# Patient Record
Sex: Male | Born: 1985 | Hispanic: No | Marital: Married | Smoking: Former smoker
Health system: Southern US, Community
[De-identification: ages and names within clinical notes are randomized; demographics above are authoritative.]

## PROBLEM LIST (undated history)

## (undated) DIAGNOSIS — L309 Dermatitis, unspecified: Secondary | ICD-10-CM

## (undated) DIAGNOSIS — E669 Obesity, unspecified: Secondary | ICD-10-CM

## (undated) DIAGNOSIS — I10 Essential (primary) hypertension: Secondary | ICD-10-CM

## (undated) DIAGNOSIS — K219 Gastro-esophageal reflux disease without esophagitis: Secondary | ICD-10-CM

## (undated) DIAGNOSIS — E119 Type 2 diabetes mellitus without complications: Secondary | ICD-10-CM

## (undated) DIAGNOSIS — N471 Phimosis: Secondary | ICD-10-CM

## (undated) HISTORY — PX: KNEE ARTHROSCOPY W/ ACL RECONSTRUCTION: SHX1858

---
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
## 2009-05-04 ENCOUNTER — Emergency Department (HOSPITAL_COMMUNITY): Admission: EM | Admit: 2009-05-04 | Discharge: 2009-05-05 | Payer: Self-pay | Admitting: Emergency Medicine

## 2010-05-08 ENCOUNTER — Emergency Department (HOSPITAL_COMMUNITY): Admission: EM | Admit: 2010-05-08 | Discharge: 2010-05-08 | Payer: Self-pay | Admitting: Emergency Medicine

## 2010-05-12 ENCOUNTER — Emergency Department (HOSPITAL_COMMUNITY): Admission: EM | Admit: 2010-05-12 | Discharge: 2010-05-12 | Payer: Self-pay | Admitting: Emergency Medicine

## 2010-09-22 LAB — DIFFERENTIAL
Basophils Absolute: 0 10*3/uL (ref 0.0–0.1)
Eosinophils Relative: 2 % (ref 0–5)
Lymphocytes Relative: 32 % (ref 12–46)
Lymphs Abs: 1.8 10*3/uL (ref 0.7–4.0)
Monocytes Absolute: 0.4 10*3/uL (ref 0.1–1.0)
Monocytes Relative: 8 % (ref 3–12)
Neutro Abs: 3.3 10*3/uL (ref 1.7–7.7)

## 2010-09-22 LAB — CBC
HCT: 44 % (ref 39.0–52.0)
Hemoglobin: 15.2 g/dL (ref 13.0–17.0)
RBC: 4.82 MIL/uL (ref 4.22–5.81)
RDW: 12.6 % (ref 11.5–15.5)

## 2010-09-22 LAB — POCT CARDIAC MARKERS
CKMB, poc: 3.1 ng/mL (ref 1.0–8.0)
Troponin i, poc: <0.05 ng/mL (ref 0.00–0.09)

## 2010-11-05 NOTE — Op Note (Signed)
NAME:  Perry Gibbs, Perry Gibbs                             ACCOUNT NO.:  [id number]   MEDICAL RECORD NO.:  [id number]                   PATIENT TYPE:  EMS   LOCATION:  ED                                   FACILITY:  [facility]   PHYSICIAN:  Katy Fitch. Naaman Plummer., M.D.          DATE OF BIRTH:  1986/02/14   DATE OF PROCEDURE:  01/24/2004  DATE OF DISCHARGE:                                 OPERATIVE REPORT   PREOPERATIVE DIAGNOSIS:  Paint gun injection injury of left palm involving  metacarpal region of index finger extending to carpal canal and extending  along superficial flexor sheath, left index finger.   POSTOPERATIVE DIAGNOSIS:  Paint gun injection injury of left palm involving  metacarpal region of index finger extending to carpal canal and extending  along superficial flexor sheath, left index finger.   OPERATION:  Wide excision and extensive debridement of injected latex paint  into left hand including palm, carpal tunnel region, and superficial areas  of flexor sheath, index finger.   OPERATING SURGEON:  Josephine Igo, M.D.   ASSISTANT:  None.   ANESTHESIA:  Marcaine 0.25% and 1% lidocaine wrist block of median and  radial nerve supplemented by IV Versed and propofol.   SUPERVISING ANESTHESIOLOGIST:  Dr. Shireen Quan   INDICATIONS:  Perry Gibbs is an 25 year old, right-hand-dominant Hispanic  male, who presented to the [facility] Emergency Room shortly following 8 p.m. this  evening, reporting a home paint gun injection of his left palm.   He was planning on painting the walls of the house with a latex water-based  paint, when he accidentally discharged the paint gun into his left palm,  distal to the thenar eminence overlying the index metacarpal neck.   He had acute pain, swelling, and inability to fully flex his index finger.  He noted altered sensibility in the index finger.   He was brought to the [facility] Emergency Room where he was evaluated by Dr.  Jennette Kettle, and a hand trauma consult was  activated.   Through translation with Faustino, a registered nurse, who works at the  [employer], we had a detailed history, completed physical  examination, and discussed his injury in detail with Perry Gibbs.   We informed Perry Gibbs that he appeared to have a severe pain injection into  the left hand that would require urgent incision and drainage and  debridement on an acute basis.   We further informed him that he would require serial debridement and might  require a series of whirlpool treatments and serial wound care.   The prognosis with these injuries is often quite poor with the risk of  secondary infection, significant stiffness, and chronic impairment of  sensibility.   He was advised to proceed directly to the operating room for incision and  drainage at this time with further discussion of prognosis once we knew the  exact extent of his paint injection predicament.  After informed consent, he is brought to the operating room at this time.   DESCRIPTION OF PROCEDURE:  Perry Gibbs was brought to the operating room and  placed in supine position upon the operating room table.   Following very light sedation with Versed and propofol, the left arm was  prepped with Betadine surgical solution and sterilely draped.  A pneumatic  tourniquet was applied to the proximal brachium.   A total of 7 mL of 0.25% Marcaine and 1% lidocaine mixed 50:50 were injected  into the region of the median nerve at the wrist and the area of the radial  sensory branches along the region of the radial styloid.   After 10 minutes, we had satisfactory anesthesia.   The arm was exsanguinated with an Esmarch bandage and an arterial tourniquet  to the proximal brachium was inflated to 250 mmHg due to mild systolic  hypertension.   A Bruner zigzag incision was fashioned, extending the traumatic wound along  the thenar crease proximally and distally towards the proximal finger   flexion crease.   A very large volume of off-white latex paint was encountered, both  superficial and deep to the palmar fascia.   The palmar fascia was meticulously dissected and was thoroughly stained with  paint.  A large volume of paint was recovered from between the deep surface  of the palmar fascia, pretendinous fibers, and the flexor sheath.  There was  considerable paint surrounding the A1 pulley, extending distally towards the  A2 pulley.   It did not appear that the flexor sheath had been directly injected.  The  paint did, however, track superficial to the flexor tendons, towards the  carpal, and into the distal carpal canal.   The wound was extended proximally to the level of the carpal canal, and all  visible pain deposits were removed with a small rongeur.   There was considerable staining of the fat and fascia.  The fascia was  resected, and the stained fat was thoroughly lavaged with copious saline and  abraded with a saline-soaked sponge.   About 95% of the paint volume was extracted; however, there was considerable  staining of the tissues including the deep surface of the dermis that simply  could not be removed short of simply excising the tissue.   The flexor tendons were carefully inspected.  I could identify any paint  that had passed distally beyond the A1 pulley and could not find paint that  had gotten into the carpal canal in large volume except for this most distal  portion of it along the path of the superficialis tendon to the index  finger.   The wound was copiously lavaged and subsequently dressed with Adaptic.  The  tourniquet was released and hemostasis controlled by direct pressure.  A  voluminous gauze sterile Webril and Ace wrap dressing was applied.   There were no operative complications.   Perry Gibbs tolerated the surgery and anesthesia well.  He was transferred to  the recovery room with stable vital signs.  He will be discharged home  to the care of family members with prescription  for Percocet 5 mg 1-2 tabs q.4-6h. p.r.n. pain, Motrin 600 mg 1 p.o. q.6h.  p.r.n. pain, and Keflex 500 mg 1 p.o. q.8h. x 4 days as a prophylactic  antibiotic.   He was given 1 g of Ancef as an IV prophylactic antibiotic in the  perioperative period.  Katy Fitch Naaman Plummer., M.D.    RVS/MEDQ  D:  01/24/2004  T:  01/25/2004  Job:  [id number]   cc:   Dr. Teressa Senter (2 copies)   Anesthesia

## 2010-11-05 NOTE — Consult Note (Signed)
NAME:  Perry Gibbs, Perry Gibbs                             ACCOUNT NO.:  [id number]   MEDICAL RECORD NO.:  [id number]                   PATIENT TYPE:  EMS   LOCATION:  ED                                   FACILITY:  [facility]   PHYSICIAN:  Katy Fitch. Naaman Plummer., M.D.          DATE OF BIRTH:  1986/05/28   DATE OF CONSULTATION:  01/24/2004  DATE OF DISCHARGE:                                   CONSULTATION   CHIEF COMPLAINT:  Acute latex paint injection injury to left palm overlying  flexor tendons to index finger with pain and impairment of index finger  function.   HISTORY OF PRESENT ILLNESS:  Perry Gibbs is an 25 year old right-hand  dominant Hispanic male who earlier this evening was getting ready to paint  some walls at home.  He accidentally discharged a paint gun into the left  palm over the index metacarpal region at the leve of the metacarpal neck  just proximal to the middle palmar crease.   He had an obvious swelling in this region and had clearly injected a  quantity of paint into his palmar space.   He presented to the emergency room complaining of pain, altered sensibility,  and a burning sensation.   He was seen with the aid of a Spanish-English translator who is a Landscape architect.  We conducted a detailed history and physical.  In brief, he was  noted to be a healthy Hispanic male with no drug allergies and no history of  a serious illness.  He was on no routine medications.  He had no prior  anesthesia problems.   PHYSICAL EXAMINATION:  A well-appearing 25 year old.  His examination was  normal with the exception of his left hand which had a small entrance wound  measuring 5 mm in length adjacent to the thenar crease overlying the index  metacarpal neck region.  He had an obvious convexity consistent with the  paint injection deposit.  He noted demented sensibility of index finger,  normal sensibility in the long, ring, and small fingers.  He could fully  flex the long, ring,  and small fingers and thumb, and had no sign of  vascular impairment.   With the aid of the interpreter, we reviewed his predicament in detail.  We  informed him that this was a serious injury and that we could not provide a  prognosis until we had explored the hand.  He understands that we will need  to fully evaluate the wound in the operating room, debrid as much paint as  possible, and pack his wound open.  He will require serial whirlpool  treatments and wound care.  He understands that there can be significant  morbidity with these injuries, including secondary infection, chronic  numbness, chronic stiffness, even loss of the digit should he become  severely infected or severely dystrophic following this injury.   We recommended proceeding directly to the  OR for acute irrigation and  debridement of his wound, packing the wound open, and may need to perform  second and/or third look surgeries at a later date.   Informed consent was obtained directly from Mr. Matthew Saras with the aid of a  registered nurse who is fluent in [place] and [place].  His relative was a  witness.   After informed consent, he is brought to the operating room at this time.   His vital signs were notable for a temperature of 98.6, blood pressure  130/80 left arm, pulse 78 and regular, respirations 22.  His clinical  examination was noted on the ER sheets as normal with the exception of the  afore mentioned left upper extremity predicament.                                               Katy Fitch Naaman Plummer., M.D.    RVS/MEDQ  D:  01/24/2004  T:  01/24/2004  Job:  [id number]

## 2011-04-11 ENCOUNTER — Encounter (HOSPITAL_COMMUNITY)
Admission: RE | Admit: 2011-04-11 | Discharge: 2011-04-11 | Disposition: A | Discharge status: Home / Self Care | Payer: Worker's Compensation | Source: Ambulatory Visit | Attending: Orthopedic Surgery | Admitting: Orthopedic Surgery

## 2011-04-11 ENCOUNTER — Ambulatory Visit (HOSPITAL_COMMUNITY)
Admission: RE | Admit: 2011-04-11 | Discharge: 2011-04-11 | Disposition: A | Discharge status: Home / Self Care | Payer: Worker's Compensation | Source: Ambulatory Visit | Attending: Orthopedic Surgery | Admitting: Orthopedic Surgery

## 2011-04-11 ENCOUNTER — Other Ambulatory Visit: Payer: Self-pay | Admitting: Orthopedic Surgery

## 2011-04-11 DIAGNOSIS — Z01811 Encounter for preprocedural respiratory examination: Secondary | ICD-10-CM

## 2011-04-11 DIAGNOSIS — Z01812 Encounter for preprocedural laboratory examination: Secondary | ICD-10-CM | POA: Insufficient documentation

## 2011-04-11 DIAGNOSIS — Z01818 Encounter for other preprocedural examination: Secondary | ICD-10-CM | POA: Insufficient documentation

## 2011-04-11 LAB — CBC
HCT: 43.4 % (ref 39.0–52.0)
Hemoglobin: 15.9 g/dL (ref 13.0–17.0)
RBC: 5.18 MIL/uL (ref 4.22–5.81)
WBC: 5 10*3/uL (ref 4.0–10.5)

## 2011-04-11 LAB — BASIC METABOLIC PANEL
CO2: 26 mEq/L (ref 19–32)
Chloride: 102 mEq/L (ref 96–112)
Creatinine, Ser: 0.76 mg/dL (ref 0.50–1.35)
GFR calc Af Amer: >90 mL/min (ref >90)
Potassium: 4 mEq/L (ref 3.5–5.1)
Sodium: 141 mEq/L (ref 135–145)

## 2011-04-11 LAB — PROTIME-INR
INR: 1.02 (ref 0.00–1.49)
Prothrombin Time: 13.6 seconds (ref 11.6–15.2)

## 2011-04-11 LAB — SURGICAL PCR SCREEN
MRSA, PCR: NEGATIVE
Staphylococcus aureus: NEGATIVE

## 2011-04-19 ENCOUNTER — Ambulatory Visit (HOSPITAL_COMMUNITY): Payer: Worker's Compensation

## 2011-04-19 ENCOUNTER — Ambulatory Visit (HOSPITAL_COMMUNITY)
Admission: RE | Admit: 2011-04-19 | Discharge: 2011-04-20 | Disposition: A | Discharge status: Home / Self Care | Payer: Worker's Compensation | Source: Ambulatory Visit | Attending: Orthopedic Surgery | Admitting: Orthopedic Surgery

## 2011-04-19 DIAGNOSIS — Z01818 Encounter for other preprocedural examination: Secondary | ICD-10-CM | POA: Insufficient documentation

## 2011-04-19 DIAGNOSIS — Z01812 Encounter for preprocedural laboratory examination: Secondary | ICD-10-CM | POA: Insufficient documentation

## 2011-04-19 DIAGNOSIS — M238X9 Other internal derangements of unspecified knee: Secondary | ICD-10-CM | POA: Insufficient documentation

## 2011-04-19 DIAGNOSIS — Z01810 Encounter for preprocedural cardiovascular examination: Secondary | ICD-10-CM | POA: Insufficient documentation

## 2011-04-19 DIAGNOSIS — Z5333 Arthroscopic surgical procedure converted to open procedure: Secondary | ICD-10-CM | POA: Insufficient documentation

## 2011-04-19 DIAGNOSIS — M2240 Chondromalacia patellae, unspecified knee: Secondary | ICD-10-CM | POA: Insufficient documentation

## 2011-04-28 ENCOUNTER — Emergency Department (HOSPITAL_COMMUNITY): Payer: Self-pay

## 2011-04-28 ENCOUNTER — Emergency Department (HOSPITAL_COMMUNITY)
Admission: EM | Admit: 2011-04-28 | Discharge: 2011-04-29 | Disposition: A | Discharge status: Home / Self Care | Payer: Self-pay | Attending: Emergency Medicine | Admitting: Emergency Medicine

## 2011-04-28 ENCOUNTER — Encounter: Payer: Self-pay | Admitting: *Deleted

## 2011-04-28 DIAGNOSIS — K5900 Constipation, unspecified: Secondary | ICD-10-CM | POA: Insufficient documentation

## 2011-04-28 DIAGNOSIS — R109 Unspecified abdominal pain: Secondary | ICD-10-CM | POA: Insufficient documentation

## 2011-04-28 DIAGNOSIS — Z7982 Long term (current) use of aspirin: Secondary | ICD-10-CM | POA: Insufficient documentation

## 2011-04-28 DIAGNOSIS — Z79899 Other long term (current) drug therapy: Secondary | ICD-10-CM | POA: Insufficient documentation

## 2011-04-28 DIAGNOSIS — R110 Nausea: Secondary | ICD-10-CM | POA: Insufficient documentation

## 2011-04-28 MED ORDER — BISACODYL 10 MG RE SUPP
10.0000 mg | Freq: Once | RECTAL | Status: AC
  Administered 2011-04-28: 10 mg via RECTAL
  Filled 2011-04-28: qty 1

## 2011-04-28 MED ORDER — FLEET ENEMA 7-19 GM/118ML RE ENEM
1.0000 | ENEMA | Freq: Once | RECTAL | Status: AC
  Administered 2011-04-28: 1 via RECTAL
  Filled 2011-04-28: qty 1

## 2011-04-28 MED ORDER — POLYETHYLENE GLYCOL 3350 17 G PO PACK: 17.0000 g | PACK | Freq: Every day | ORAL | Status: AC

## 2011-04-28 NOTE — ED Provider Notes (Signed)
History     CSN: [id number] Arrival date & time: 04/28/2011  5:05 PM   None     Chief Complaint  Patient presents with  . Constipation   patient underwent knee surgery on October 30. He has had constipation since the surgery. Last bowel movement 4 days ago after using enema. Complains of diffuse abdominal pain. Mild nausea, no vomiting. He is currently taking Robaxin and hydrocodone. Denies any fevers.     (Consider location/radiation/quality/duration/timing/severity/associated sxs/prior treatment) HPI  History reviewed. No pertinent past medical history.  Past Surgical History  Procedure Date  . Knee arthroscopy w/ acl reconstruction     No family history on file.  History  Substance Use Topics  . Smoking status: Current Everyday Smoker  . Smokeless tobacco: Not on file  . Alcohol Use: No      Review of Systems  All other systems reviewed and are negative.    Allergies  Review of patient's allergies indicates no known allergies.  Home Medications   Current Outpatient Rx  Name Route Sig Dispense Refill  . ASPIRIN 325 MG PO TBEC Oral Take 325 mg by mouth daily.      Marland Kitchen HYDROCODONE-ACETAMINOPHEN 5-325 MG PO TABS Oral Take 1 tablet by mouth every 6 (six) hours as needed. FOR PAIN     . METHOCARBAMOL 500 MG PO TABS Oral Take 500 mg by mouth every 8 (eight) hours as needed. FOR MUSCLE SPASMS     . ONDANSETRON HCL 4 MG PO TABS Oral Take 4 mg by mouth every 6 (six) hours as needed. FOR NAUSEA     . RANITIDINE HCL 150 MG PO TABS Oral Take 150 mg by mouth daily.        BP 143/83  Pulse 89  Temp(Src) 98 F (36.7 C) (Oral)  Resp 16  SpO2 97%  Physical Exam  Constitutional: He is oriented to person, place, and time. He appears well-developed and well-nourished. No distress.  HENT:  Head: Normocephalic and atraumatic.  Eyes: Conjunctivae and EOM are normal. Pupils are equal, round, and reactive to light.  Neck: Neck supple.  Cardiovascular: Normal rate and  regular rhythm.  Exam reveals no gallop and no friction rub.   No murmur heard. Pulmonary/Chest: Breath sounds normal. He has no wheezes. He has no rales. He exhibits no tenderness.  Abdominal: Soft. He exhibits no distension. There is tenderness. There is no rebound and no guarding.       No distention, slightly decreased bowel sounds, mild diffuse tenderness to palpation. No rebound or guarding.  Musculoskeletal: Normal range of motion.       Incision of left knee is intact, no purulence. No erythema. No significant swelling. No calf tenderness  Neurological: He is alert and oriented to person, place, and time. No cranial nerve deficit. Coordination normal.  Skin: Skin is warm and dry. No rash noted.  Psychiatric: He has a normal mood and affect.    ED Course  Procedures (including critical care time)  Labs Reviewed - No data to display No results found.   No diagnosis found.    MDM  Pt is seen and examined;  Initial history and physical completed.  Will follow.          Ivi Griffith A. Patrica Duel, MD 04/28/11 [id number]

## 2011-04-28 NOTE — ED Notes (Signed)
Report given to [facility], [place].  Patient to move to RM 30.

## 2011-04-28 NOTE — ED Notes (Signed)
Pt was not able to hold enema for required period but did report small bowel movement. PT reports feels better and "emptier". MD to be made aware.

## 2011-04-28 NOTE — ED Notes (Signed)
Patient reports he had knee surgery on 10-30.  He has had constipation since the surgery.  Last bm was Sunday after enema.  Patient complains of abd pain.  Patient denies nausea.  Patient surgery done by Dr August Saucer

## 2014-10-03 ENCOUNTER — Emergency Department (HOSPITAL_COMMUNITY)
Admission: EM | Admit: 2014-10-03 | Discharge: 2014-10-03 | Disposition: A | Discharge status: Home / Self Care | Payer: Self-pay | Attending: Emergency Medicine | Admitting: Emergency Medicine

## 2014-10-03 ENCOUNTER — Emergency Department (HOSPITAL_COMMUNITY): Payer: Self-pay

## 2014-10-03 ENCOUNTER — Encounter (HOSPITAL_COMMUNITY): Payer: Self-pay | Admitting: *Deleted

## 2014-10-03 DIAGNOSIS — E669 Obesity, unspecified: Secondary | ICD-10-CM | POA: Insufficient documentation

## 2014-10-03 DIAGNOSIS — R42 Dizziness and giddiness: Secondary | ICD-10-CM | POA: Insufficient documentation

## 2014-10-03 DIAGNOSIS — Z720 Tobacco use: Secondary | ICD-10-CM | POA: Insufficient documentation

## 2014-10-03 DIAGNOSIS — I10 Essential (primary) hypertension: Secondary | ICD-10-CM | POA: Insufficient documentation

## 2014-10-03 DIAGNOSIS — Z79899 Other long term (current) drug therapy: Secondary | ICD-10-CM | POA: Insufficient documentation

## 2014-10-03 DIAGNOSIS — Z7982 Long term (current) use of aspirin: Secondary | ICD-10-CM | POA: Insufficient documentation

## 2014-10-03 DIAGNOSIS — R079 Chest pain, unspecified: Secondary | ICD-10-CM | POA: Insufficient documentation

## 2014-10-03 HISTORY — DX: Obesity, unspecified: E66.9

## 2014-10-03 HISTORY — DX: Essential (primary) hypertension: I10

## 2014-10-03 LAB — I-STAT TROPONIN, ED: TROPONIN I, POC: 0 ng/mL (ref 0.00–0.08)

## 2014-10-03 LAB — CBG MONITORING, ED: GLUCOSE-CAPILLARY: 97 mg/dL (ref 70–99)

## 2014-10-03 LAB — I-STAT CHEM 8, ED
BUN: 26 mg/dL — ABNORMAL HIGH (ref 6–23)
CALCIUM ION: 1.15 mmol/L (ref 1.12–1.23)
CHLORIDE: 103 mmol/L (ref 96–112)
CREATININE: 0.9 mg/dL (ref 0.50–1.35)
GLUCOSE: 97 mg/dL (ref 70–99)
HCT: 46 % (ref 39.0–52.0)
Hemoglobin: 15.6 g/dL (ref 13.0–17.0)
Potassium: 3.5 mmol/L (ref 3.5–5.1)
Sodium: 141 mmol/L (ref 135–145)
TCO2: 23 mmol/L (ref 0–100)

## 2014-10-03 LAB — CBC WITH DIFFERENTIAL/PLATELET
BASOS PCT: 0 % (ref 0–1)
Basophils Absolute: 0 10*3/uL (ref 0.0–0.1)
EOS ABS: 0.2 10*3/uL (ref 0.0–0.7)
EOS PCT: 4 % (ref 0–5)
HEMATOCRIT: 43 % (ref 39.0–52.0)
HEMOGLOBIN: 15 g/dL (ref 13.0–17.0)
LYMPHS PCT: 33 % (ref 12–46)
Lymphs Abs: 1.5 10*3/uL (ref 0.7–4.0)
MCH: 30.1 pg (ref 26.0–34.0)
MCHC: 34.9 g/dL (ref 30.0–36.0)
MCV: 86.2 fL (ref 78.0–100.0)
MONOS PCT: 9 % (ref 3–12)
Monocytes Absolute: 0.4 10*3/uL (ref 0.1–1.0)
Neutro Abs: 2.4 10*3/uL (ref 1.7–7.7)
Neutrophils Relative %: 54 % (ref 43–77)
Platelets: 157 10*3/uL (ref 150–400)
RBC: 4.99 MIL/uL (ref 4.22–5.81)
RDW: 13.2 % (ref 11.5–15.5)
WBC: 4.5 10*3/uL (ref 4.0–10.5)

## 2014-10-03 LAB — D-DIMER, QUANTITATIVE: D-Dimer, Quant: 0.31 ug/mL-FEU (ref 0.00–0.48)

## 2014-10-03 MED ORDER — MECLIZINE HCL 50 MG PO TABS: 50.0000 mg | ORAL_TABLET | Freq: Two times a day (BID) | ORAL | Status: DC | PRN

## 2014-10-03 MED ORDER — SODIUM CHLORIDE 0.9 % IV BOLUS (SEPSIS)
1000.0000 mL | Freq: Once | INTRAVENOUS | Status: AC
  Administered 2014-10-03: 1000 mL via INTRAVENOUS

## 2014-10-03 MED ORDER — MORPHINE SULFATE 4 MG/ML IJ SOLN
4.0000 mg | Freq: Once | INTRAMUSCULAR | Status: AC
  Administered 2014-10-03: 4 mg via INTRAVENOUS
  Filled 2014-10-03: qty 1

## 2014-10-03 MED ORDER — ONDANSETRON HCL 4 MG/2ML IJ SOLN
4.0000 mg | Freq: Once | INTRAMUSCULAR | Status: AC
  Administered 2014-10-03: 4 mg via INTRAVENOUS
  Filled 2014-10-03: qty 2

## 2014-10-03 MED ORDER — MECLIZINE HCL 25 MG PO TABS
25.0000 mg | ORAL_TABLET | Freq: Once | ORAL | Status: AC
  Administered 2014-10-03: 25 mg via ORAL
  Filled 2014-10-03: qty 1

## 2014-10-03 NOTE — ED Notes (Signed)
Pt cbg 97.

## 2014-10-03 NOTE — ED Notes (Signed)
Patient ambulated well down hallway and back 

## 2014-10-03 NOTE — Discharge Instructions (Signed)
Vrtigo (Vertigo)  Vrtigo es la sensacin de que se est moviendo estando quieto. Puede ser peligroso si ocurre cuando est trabajado, conduciendo vehculos o realizando actividades difciles.  CAUSAS  El vrtigo se produce cuando hay un conflicto en las seales que se envan al cerebro desde los sistemas visual y sensorial del cuerpo. Hay numerosas causas que Dole Foodoriginan este problema, entre las que se incluyen:   Infecciones, especialmente en el odo interno.  Nelia ShiUna mala reaccin a un medicamento o mal uso de alcohol y frmacos.  Abstinencia de drogas o alcohol.  Cambios rpidos de posicin, como al D.[employer]. Horton, Incacostarse o darse vuelta en la cama.  Dolor de Surveyor, mineralscabeza migraoso.  Disminucin del flujo sanguneo hacia el cerebro.  Aumento de la presin en el cerebro por un traumatismo, infeccin, tumor o sangrado en la cabeza. SNTOMAS  Puede sentir como si el mundo da vueltas o va a caer al piso. Como hay problemas en el equilibrio, el vrtigo puede causar nuseas y vmitos. Tiene movimientos oculares involuntarios (nistagmus).  DIAGNSTICO  El vrtigo normalmente se diagnostica con un examen fsico. Si la causa no se conoce, el mdico puede indicar diagnstico por imgenes, como una resonancia magntica (imgenes por Health visitorresonancia magntica).  TRATAMIENTO  La mayor parte de los casos de vrtigo se resuelve sin TEFL teachertratamiento. Segn la causa, el mdico podr recetar ciertos medicamentos. Si se relaciona con la posicin del cuerpo, podr recomendarle movimientos o procedimientos para corregir el problema. En algunos casos raros, si la causa del vrtigo es un problema en el odo interno, necesitar [place]una ciruga.  INSTRUCCIONES PARA EL CUIDADO DOMICILIARIO  Siga las indicaciones del mdico.  Evite conducir vehculos.  Evite operar maquinarias pesadas.  Evite realizar tareas que seran peligrosas para usted u otras personas durante un episodio de vrtigo.  Comunquele al mdico si nota que ciertos medicamentos  parecen asociarse con las crisis. Algunos medicamentos que se usan para tratar los episodios, en Guardian Life Insurancealgunas personas los empeoran. SOLICITE ATENCIN MDICA DE INMEDIATO SI:  Los medicamentos no Samoaalivian las crisis o hacen que estas empeoren.  Tiene dificultad para hablar, caminar, siente debilidad o tiene problemas para Boeingusar los brazos, las manos o las piernas.  Comienza a sufrir un dolor de cabeza intenso.  Las nuseas y los vmitos no se Samoaalivian o se Press photographeragravan.  Aparecen trastornos visuales.  Un miembro de su familia nota cambios en su conducta.  Hay alguna modificacin en su trastorno que parece Holiday representativehacerlo empeorar en lugar de Scientist, clinical (histocompatibility and immunogenetics)mejorar. ASEGRESE DE QUE:   Comprende estas instrucciones.  Controlar su enfermedad.  Solicitar ayuda de inmediato si no mejora o si empeora. Document Released: 03/16/2005 Document Revised: 08/29/2011 [facility] Patient Information 2015 [place], [place]. This information is not intended to replace advice given to you by your health care provider. Make sure you discuss any questions you have with your health care provider. Dolor de pecho (no especfico) (Chest Pain (Nonspecific)) Con frecuencia es difcil dar un diagnstico especfico de la causa del dolor de [place]pecho. Siempre hay una posibilidad de que el dolor podra estar relacionado con algo grave, como un ataque al corazn o un cogulo sanguneo en los pulmones. Debe someterse a controles con el mdico para ms evaluaciones. CAUSAS   Acidez.  Neumona o bronquitis.  Ansiedad o estrs.  Inflamacin de la zona que rodea al corazn (pericarditis) o a los pulmones (pleuritis o pleuresa).  Un cogulo sanguneo en el pulmn.  Colapso de un pulmn (neumotrax), que puede aparecer de manera repentina por s solo (neumotrax espontneo) o  debido a un traumatismo en el trax.  Culebrilla (virus del herpes zster). La pared torcica est compuesta por huesos, msculos y TEFL teacher. Cualquiera de estos puede ser la  fuente del dolor.  Puede haber una contusin en los huesos debido a una lesin.  Puede haber un esguince en los msculos o el cartlago ocasionado por la tos o por [place].  El cartlago puede verse afectado por una inflamacin y Psychologist, counselling (costocondritis). DIAGNSTICO  Gretchen Short se necesiten anlisis de laboratorio u otros estudios para Veterinary surgeon causa del Engineer, mining. Adems, puede indicarle que se haga una prueba llamada electrocadiograma (ECG) ambulatorio. El ECG registra los patrones de los latidos cardacos durante 24horas. Adems, pueden hacerle otros estudios, por ejemplo:  Ecocardiograma transtorcico (ETT). Durante Management consultant, se usan ondas sonoras para evaluar el flujo de la sangre a travs del corazn.  Ecocardiograma transesofgico (ETE).  Monitoreo cardaco. Permite que el mdico controle la frecuencia y el ritmo cardaco en tiempo real.  Monitor Holter. Es un dispositivo porttil que eBay latidos cardacos y [place] a Education administrator las arritmias cardacas. Le permite al [employer] registrar la actividad cardaca durante varios das, si es necesario.  Pruebas de estrs por ejercicio o por medicamentos que aceleran los latidos cardacos. TRATAMIENTO   El tratamiento depende de la causa del dolor de [place]. El tratamiento puede incluir:  Inhibidores de la acidez estomacal.  Antiinflamatorios.  Analgsicos para las enfermedades inflamatorias.  Antibiticos, si hay una infeccin.  Podrn aconsejarle que modifique su estilo de vida. Esto incluye dejar de fumar y evitar el alcohol, la cafena y el chocolate.  Pueden aconsejarle que mantenga la cabeza levantada (elevada) cuando duerme. Esto reduce la probabilidad de que el cido retroceda del estmago al esfago. En la [employer], el dolor de pecho no especfico mejorar en el trmino de 2 a 3das, con reposo y IAC/InterActiveCorp.  INSTRUCCIONES PARA EL CUIDADO EN EL HOGAR   Si le prescriben  antibiticos, tmelos tal como se le indic. Termnelos aunque comience a sentirse mejor.  [address], no haga actividades fsicas que provoquen dolor de [place]. Contine con las actividades fsicas tal como se le indic  No consuma ningn producto que contenga tabaco, incluidos cigarrillos, tabaco de Theatre manager o cigarrillos electrnicos.  Evite el consumo de alcohol.  Tome los medicamentos solamente como se lo haya indicado el mdico.  Siga las sugerencias del mdico en lo que respecta a las pruebas adicionales, si el dolor de pecho no desaparece.  Concurra a todas las visitas de control programadas. Si no lo hace, podra desarrollar problemas permanentes (crnicos) relacionados con el dolor. Si hay algn problema para concurrir a una cita, llame para reprogramarla. SOLICITE ATENCIN MDICA SI:   El dolor de pecho no desaparece, incluso despus del tratamiento.  Tiene una erupcin cutnea con ampollas en el pecho.  Tiene fiebre. SOLICITE ATENCIN MDICA DE [employer] SI:   Aumenta el dolor de pecho o este se irradia hacia el brazo, el cuello, la [place], la espalda o el abdomen.  Le falta el aire.  La tos empeora, o expectora sangre.  Siente dolor intenso en la espalda o el abdomen.  Se siente nauseoso o vomita.  Siente debilidad intensa.  Se desmaya.  Tiene escalofros. Esto es Radio broadcast assistant. No espere a ver si el dolor se pasa. Obtenga ayuda mdica de inmediato. Llame a los servicios de emergencia locales (911 en los [place]). No conduzca por sus propios medios [employer]. ASEGRESE DE  QUE:   Comprende estas instrucciones.  Controlar su afeccin.  Recibir ayuda de inmediato si no mejora o si empeora. Document Released: 06/06/2005 Document Revised: 06/11/2013 [facility] Patient Information 2015 [place], [place]. This information is not intended to replace advice given to you by your health care provider. Make sure you discuss any questions  you have with your health care provider.

## 2014-10-03 NOTE — ED Provider Notes (Signed)
CSN: [id number]     Arrival date & time 10/03/14  1524 History   First MD Initiated Contact with Patient 10/03/14 1605     Chief Complaint  Patient presents with  . Dizziness  . Nausea     (Consider location/radiation/quality/duration/timing/severity/associated sxs/prior Treatment) HPI   29 year old obese Hispanic male with hx of HTN presents c/o dizziness and nausea.  Hx obtain through spouse who is at bedside.  Pt report a intermittent L sided chest pressure sensation accompany with nausea, dizziness, and "agitation" since yesterday. 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 Report dizziness worsen with positional change. Report sxs felt different from his heart burn.  Nothing seems to make it better or worse.  No specific treatment tried.  Report family of early cardiac disease with father having MI in his 2230s.  Pt is a smoker.  He's also on BB and has been on it for the past 2 years.  He report having leg swelling R>L for over a week, report travel to [place] last week. He denies prior hx of PE/DVT, no recent surgery, prolonged bed rest, hemoptysis, hx of active cancer.  No specific treatment tried.  Denies active CP at this time.  But continues to endorse feeling unease and agitated.    Past Medical History  Diagnosis Date  . Obesity   . Hypertension    Past Surgical History  Procedure Laterality Date  . Knee arthroscopy w/ acl reconstruction     History reviewed. No pertinent family history. History  Substance Use Topics  . Smoking status: Current Every Day Smoker  . Smokeless tobacco: Not on file  . Alcohol Use: No    Review of Systems  All other systems reviewed and are negative.     Allergies  Review of patient's allergies indicates no known allergies.  Home Medications   Prior to Admission medications   Medication Sig Start Date End Date Taking? Authorizing Provider  aspirin 325 MG EC tablet Take 325 mg by mouth  daily.      Historical Provider, MD  HYDROcodone-acetaminophen (NORCO) 5-325 MG per tablet Take 1 tablet by mouth every 6 (six) hours as needed. FOR PAIN     Historical Provider, MD  methocarbamol (ROBAXIN) 500 MG tablet Take 500 mg by mouth every 8 (eight) hours as needed. FOR MUSCLE SPASMS     Historical Provider, MD  ondansetron (ZOFRAN) 4 MG tablet Take 4 mg by mouth every 6 (six) hours as needed. FOR NAUSEA     Historical Provider, MD  ranitidine (ZANTAC 150 MAXIMUM STRENGTH) 150 MG tablet Take 150 mg by mouth daily.      Historical Provider, MD   BP 130/85 mmHg  Pulse 69  Temp(Src) 98.5 F (36.9 C) (Oral)  Resp 18  SpO2 95% Physical Exam  Constitutional: He is oriented to person, place, and time. He appears well-developed and well-nourished. No distress.  HENT:  Head: Atraumatic.  Eyes: Conjunctivae are normal.  Neck: Normal range of motion. Neck supple. No JVD present.  Cardiovascular: Normal rate, regular rhythm and intact distal pulses.   Pulmonary/Chest: Effort normal and breath sounds normal. No respiratory distress. He has no wheezes. He exhibits no tenderness.  Abdominal: Soft. Bowel sounds are normal. He exhibits no distension. There is no tenderness.  Musculoskeletal: He exhibits no edema.  Neurological: He is alert and oriented to person, place, and time.  Skin: No rash noted.  Psychiatric: He has a  normal mood and affect.    ED Course  Procedures (including critical care time)  Report dizziness, CP, and feeling agitated.  Does have some risk factors for CAD and risk factors for PE.  Work up initiated.    6:21 PM HEART score of 2, therefore low risk of MACE.  D-dimer negative, doubt PE.    7:36 PM Patient has normal orthostatic vital sign, normal electrolytes panel, no evidence of anemia, normal d-dimer, normal troponin, normal blood sugar, and a normal chest x-ray. He was able to ambulate without difficulty and felt better. He does have a PCP that he can  follow-up. Return precautions discussed. No acute emergent medical condition identified during this visit.  Labs Review Labs Reviewed  I-STAT CHEM 8, ED - Abnormal; Notable for the following:    BUN 26 (*)    All other components within normal limits  CBC WITH DIFFERENTIAL/PLATELET  D-DIMER, QUANTITATIVE  I-STAT TROPOININ, ED  CBG MONITORING, ED    Imaging Review Dg Chest 2 View  10/03/2014   CLINICAL DATA:  Dizzy and lightheaded for 1 day. Left-sided chest pain.  EXAM: CHEST  2 VIEW  COMPARISON:  04/11/2011  FINDINGS: The cardiomediastinal contours are normal. The lungs are clear. Pulmonary vasculature is normal. No consolidation, pleural effusion, or pneumothorax. No acute osseous abnormalities are seen.  IMPRESSION: No acute pulmonary process.   Electronically Signed   By: Rubye Oaks M.D.   On: 10/03/2014 18:11     EKG Interpretation None      Date: 10/03/2014  Rate: 66  Rhythm: normal sinus rhythm  QRS Axis: normal  Intervals: normal  ST/T Wave abnormalities: normal  Conduction Disutrbances: none  Narrative Interpretation: one PVC.  Borderline Right Axis Deviation.  Borderline Twave abnormality in inferior leads.  Old EKG Reviewed: none for comparison     MDM   Final diagnoses:  Chest pain  Dizziness    BP 121/63 mmHg  Pulse 70  Temp(Src) 98.5 F (36.9 C) (Oral)  Resp 18  SpO2 98%  I have reviewed nursing notes and vital signs. I personally reviewed the imaging tests through PACS system  I reviewed available ER/hospitalization records thought the EMR     Fayrene Helper, PA-C 10/03/14 1950  Tilden Fossa, MD 10/03/14 2121

## 2014-10-03 NOTE — ED Notes (Signed)
Pt reports onset yesterday of dizziness and nausea. No acute distress noted at triage.

## 2016-08-26 ENCOUNTER — Encounter (HOSPITAL_COMMUNITY): Payer: Self-pay | Admitting: Emergency Medicine

## 2016-08-26 ENCOUNTER — Emergency Department (HOSPITAL_COMMUNITY)
Admission: EM | Admit: 2016-08-26 | Discharge: 2016-08-26 | Disposition: A | Discharge status: Home / Self Care | Payer: Self-pay | Attending: Emergency Medicine | Admitting: Emergency Medicine

## 2016-08-26 DIAGNOSIS — N200 Calculus of kidney: Secondary | ICD-10-CM | POA: Insufficient documentation

## 2016-08-26 DIAGNOSIS — R310 Gross hematuria: Secondary | ICD-10-CM

## 2016-08-26 DIAGNOSIS — R319 Hematuria, unspecified: Secondary | ICD-10-CM

## 2016-08-26 DIAGNOSIS — N390 Urinary tract infection, site not specified: Secondary | ICD-10-CM | POA: Insufficient documentation

## 2016-08-26 DIAGNOSIS — E119 Type 2 diabetes mellitus without complications: Secondary | ICD-10-CM | POA: Insufficient documentation

## 2016-08-26 DIAGNOSIS — I10 Essential (primary) hypertension: Secondary | ICD-10-CM | POA: Insufficient documentation

## 2016-08-26 HISTORY — DX: Type 2 diabetes mellitus without complications: E11.9

## 2016-08-26 LAB — URINALYSIS, ROUTINE W REFLEX MICROSCOPIC
BILIRUBIN URINE: NEGATIVE
Glucose, UA: NEGATIVE mg/dL
Ketones, ur: NEGATIVE mg/dL
Nitrite: POSITIVE — AB
Protein, ur: NEGATIVE mg/dL
SQUAMOUS EPITHELIAL / LPF: NONE SEEN
Specific Gravity, Urine: 1.012 (ref 1.005–1.030)
pH: 6 (ref 5.0–8.0)

## 2016-08-26 LAB — CBG MONITORING, ED: GLUCOSE-CAPILLARY: 109 mg/dL — AB (ref 65–99)

## 2016-08-26 MED ORDER — CEPHALEXIN 250 MG PO CAPS
500.0000 mg | ORAL_CAPSULE | Freq: Once | ORAL | Status: AC
  Administered 2016-08-26: 500 mg via ORAL
  Filled 2016-08-26: qty 2

## 2016-08-26 MED ORDER — IBUPROFEN 800 MG PO TABS: 800.0000 mg | ORAL_TABLET | Freq: Four times a day (QID) | ORAL | 0 refills | Status: DC | PRN

## 2016-08-26 MED ORDER — CEPHALEXIN 500 MG PO CAPS: 500.0000 mg | ORAL_CAPSULE | Freq: Three times a day (TID) | ORAL | 0 refills | Status: AC

## 2016-08-26 NOTE — ED Provider Notes (Signed)
[facility]-EMERGENCY DEPT Provider Note   CSN: [id number] Arrival date & time: 08/26/16  1405     History   Chief Complaint Chief Complaint  Patient presents with  . Hematuria    HPI Perry Gibbs is a 31 y.o. male.  The history is provided by the patient and a relative.  Male GU Problem  Primary symptoms include dysuria. Primary symptoms comment: hematuria. This is a recurrent problem. The current episode started 2 days ago. The problem occurs constantly. The problem has been gradually worsening. The symptoms occur during urination. The discharge is bloody. Associated symptoms include nausea and frequency. Pertinent negatives include no anorexia and no vomiting. There has been no fever. He has tried antibotics ("penicillin" 2 weeks ago) for the symptoms.    Past Medical History:  Diagnosis Date  . Diabetes mellitus without complication (HCC)   . Hypertension   . Obesity     There are no active problems to display for this patient.   Past Surgical History:  Procedure Laterality Date  . KNEE ARTHROSCOPY W/ ACL RECONSTRUCTION         Home Medications    Prior to Admission medications   Medication Sig Start Date End Date Taking? Authorizing Provider  aspirin 325 MG EC tablet Take 325 mg by mouth daily.      Historical Provider, MD  cephALEXin (KEFLEX) 500 MG capsule Take 1 capsule (500 mg total) by mouth 3 (three) times daily. 08/26/16 09/05/16  Lyndal Pulleyaniel Zian Mohamed, MD  HYDROcodone-acetaminophen (NORCO) 5-325 MG per tablet Take 1 tablet by mouth every 6 (six) hours as needed. FOR PAIN     Historical Provider, MD  ibuprofen (ADVIL,MOTRIN) 800 MG tablet Take 1 tablet (800 mg total) by mouth every 6 (six) hours as needed for mild pain or moderate pain. 08/26/16   Lyndal Pulleyaniel Sha Burling, MD  meclizine (ANTIVERT) 50 MG tablet Take 1 tablet (50 mg total) by mouth 2 (two) times daily as needed for dizziness or nausea. 10/03/14   Fayrene HelperBowie Tran, PA-C  methocarbamol (ROBAXIN) 500 MG tablet Take 500 mg by  mouth every 8 (eight) hours as needed. FOR MUSCLE SPASMS     Historical Provider, MD  ondansetron (ZOFRAN) 4 MG tablet Take 4 mg by mouth every 6 (six) hours as needed. FOR NAUSEA     Historical Provider, MD  ranitidine (ZANTAC 150 MAXIMUM STRENGTH) 150 MG tablet Take 150 mg by mouth daily.      Historical Provider, MD    Family History History reviewed. No pertinent family history.  Social History Social History  Substance Use Topics  . Smoking status: Current Every Day Smoker  . Smokeless tobacco: Never Used  . Alcohol use No     Allergies   Patient has no known allergies.   Review of Systems Review of Systems  Gastrointestinal: Positive for nausea. Negative for anorexia and vomiting.  Genitourinary: Positive for dysuria and frequency.  All other systems reviewed and are negative.    Physical Exam Updated Vital Signs BP 129/80   Pulse 63   Temp 98.4 F (36.9 C) (Oral)   Resp 18   SpO2 94%   Physical Exam  Constitutional: He is oriented to person, place, and time. He appears well-developed and well-nourished. No distress.  HENT:  Head: Normocephalic and atraumatic.  Nose: Nose normal.  Eyes: Conjunctivae are normal.  Neck: Neck supple. No tracheal deviation present.  Cardiovascular: Normal rate and regular rhythm.   Pulmonary/Chest: Effort normal. No respiratory distress.  Abdominal: Soft. He exhibits  no distension. There is no tenderness. There is no guarding and no CVA tenderness.  Neurological: He is alert and oriented to person, place, and time.  Skin: Skin is warm and dry.  Psychiatric: He has a normal mood and affect.  Vitals reviewed.    ED Treatments / Results  Labs (all labs ordered are listed, but only abnormal results are displayed) Labs Reviewed  URINALYSIS, ROUTINE W REFLEX MICROSCOPIC - Abnormal; Notable for the following:       Result Value   Color, Urine AMBER (*)    APPearance HAZY (*)    Hgb urine dipstick SMALL (*)    Nitrite  POSITIVE (*)    Leukocytes, UA MODERATE (*)    Bacteria, UA RARE (*)    All other components within normal limits  CBG MONITORING, ED - Abnormal; Notable for the following:    Glucose-Capillary 109 (*)    All other components within normal limits  URINE CULTURE    EKG  EKG Interpretation None       Radiology No results found.  Procedures Procedures (including critical care time)  Medications Ordered in ED Medications  cephALEXin (KEFLEX) capsule 500 mg (500 mg Oral Given 08/26/16 1827)     Initial Impression / Assessment and Plan / ED Course  I have reviewed the triage vital signs and the nursing notes.  Pertinent labs & imaging results that were available during my care of the patient were reviewed by me and considered in my medical decision making (see chart for details).     31 year old male presents with recurrent dysuria and hematuria. His family member is concerned that he may have passed a small hard object that she thought might be a kidney stone. It is unclear if this was a clot or other debris. The patient has no significant flank pain to suggest an obstructive stone. He is well-appearing, Is afebrile and is not septic. He has heavy pyuria, positive nitrites, bacteria and urine that is consistent with uncomplicated urinary tract infection with hematuria. Follow-up recommended with urology as this is his second occurrence of this within the last few weeks. He apparently was treated previously with penicillin which likely incompletely treated his infection which has now recurred. We discussed lack of emergent imaging indications today and we will treat the infection supportively with the hope that he clears his symptoms. Will treat empirically with keflex pending culture. Return precautions discussed for worsening or new concerning symptoms.   Final Clinical Impressions(s) / ED Diagnoses   Final diagnoses:  Urinary tract infection with hematuria, site unspecified    Kidney stones  Gross hematuria    New Prescriptions New Prescriptions   CEPHALEXIN (KEFLEX) 500 MG CAPSULE    Take 1 capsule (500 mg total) by mouth 3 (three) times daily.   IBUPROFEN (ADVIL,MOTRIN) 800 MG TABLET    Take 1 tablet (800 mg total) by mouth every 6 (six) hours as needed for mild pain or moderate pain.     Lyndal Pulley, MD 08/26/16 [phone]

## 2016-08-26 NOTE — ED Triage Notes (Signed)
Pt sts hematuria and dysuria x 1 week with some blood clots

## 2016-08-28 LAB — URINE CULTURE

## 2016-08-29 ENCOUNTER — Telehealth: Payer: Self-pay | Admitting: Emergency Medicine

## 2016-08-29 NOTE — Telephone Encounter (Signed)
Post ED Visit - Positive Culture Follow-up  Culture report reviewed by antimicrobial stewardship pharmacist:  [x]  Enzo BiNathan Batchelder, Pharm.D. []  Celedonio MiyamotoJeremy Frens, Pharm.D., BCPS []  Garvin FilaMike Maccia, Pharm.D. []  Georgina PillionElizabeth Martin, Pharm.D., BCPS []  LurayMinh Pham, [address]Ph[address]D., BCPS, AAHIVP []  Estella HuskMichelle Turner, Pharm.D., BCPS, AAHIVP []  Tennis Mustassie Stewart, Pharm.D. []  Sherle Poeob Vincent, [address]Pharm[address]  Positive urine culture Treated with cephalexin, organism sensitive to the same and no further patient follow-up is required at this time.  Berle MullMiller, [place]ora Weatherbee 08/29/2016, 2:45 PM

## 2018-04-30 ENCOUNTER — Other Ambulatory Visit: Payer: Self-pay

## 2018-04-30 ENCOUNTER — Encounter (HOSPITAL_COMMUNITY): Payer: Self-pay | Admitting: *Deleted

## 2018-04-30 ENCOUNTER — Emergency Department (HOSPITAL_COMMUNITY)
Admission: EM | Admit: 2018-04-30 | Discharge: 2018-05-01 | Disposition: A | Discharge status: Home / Self Care | Payer: Self-pay | Attending: Emergency Medicine | Admitting: Emergency Medicine

## 2018-04-30 DIAGNOSIS — R42 Dizziness and giddiness: Secondary | ICD-10-CM | POA: Insufficient documentation

## 2018-04-30 DIAGNOSIS — I10 Essential (primary) hypertension: Secondary | ICD-10-CM | POA: Insufficient documentation

## 2018-04-30 DIAGNOSIS — Z79899 Other long term (current) drug therapy: Secondary | ICD-10-CM | POA: Insufficient documentation

## 2018-04-30 DIAGNOSIS — Z7982 Long term (current) use of aspirin: Secondary | ICD-10-CM | POA: Insufficient documentation

## 2018-04-30 DIAGNOSIS — F17200 Nicotine dependence, unspecified, uncomplicated: Secondary | ICD-10-CM | POA: Insufficient documentation

## 2018-04-30 DIAGNOSIS — E119 Type 2 diabetes mellitus without complications: Secondary | ICD-10-CM | POA: Insufficient documentation

## 2018-04-30 DIAGNOSIS — R110 Nausea: Secondary | ICD-10-CM

## 2018-04-30 LAB — BASIC METABOLIC PANEL
ANION GAP: 11 (ref 5–15)
BUN: 17 mg/dL (ref 6–20)
CALCIUM: 9.4 mg/dL (ref 8.9–10.3)
CO2: 27 mmol/L (ref 22–32)
Chloride: 101 mmol/L (ref 98–111)
Creatinine, Ser: 0.8 mg/dL (ref 0.61–1.24)
GFR calc Af Amer: >60 mL/min (ref >60)
GFR calc non Af Amer: >60 mL/min (ref >60)
Glucose, Bld: 133 mg/dL — ABNORMAL HIGH (ref 70–99)
Potassium: 3.6 mmol/L (ref 3.5–5.1)
Sodium: 139 mmol/L (ref 135–145)

## 2018-04-30 LAB — CBC
HCT: 46.7 % (ref 39.0–52.0)
Hemoglobin: 15.6 g/dL (ref 13.0–17.0)
MCH: 29.2 pg (ref 26.0–34.0)
MCHC: 33.4 g/dL (ref 30.0–36.0)
MCV: 87.3 fL (ref 80.0–100.0)
Platelets: 158 10*3/uL (ref 150–400)
RBC: 5.35 MIL/uL (ref 4.22–5.81)
RDW: 12.7 % (ref 11.5–15.5)
WBC: 5.3 10*3/uL (ref 4.0–10.5)
nRBC: 0 % (ref 0.0–0.2)

## 2018-04-30 NOTE — ED Triage Notes (Signed)
Pt reports feeling like the room was spinning yesterday at 9pm. Today has had increased dizziness with pain to top of head and NV. CBG at home 119.

## 2018-05-01 ENCOUNTER — Emergency Department (HOSPITAL_COMMUNITY): Payer: Self-pay

## 2018-05-01 LAB — I-STAT TROPONIN, ED: Troponin i, poc: 0 ng/mL (ref 0.00–0.08)

## 2018-05-01 MED ORDER — MECLIZINE HCL 25 MG PO TABS
25.0000 mg | ORAL_TABLET | Freq: Once | ORAL | Status: AC
  Administered 2018-05-01: 25 mg via ORAL
  Filled 2018-05-01: qty 1

## 2018-05-01 MED ORDER — DIAZEPAM 5 MG PO TABS: 5.0000 mg | ORAL_TABLET | Freq: Three times a day (TID) | ORAL | 0 refills | Status: DC | PRN

## 2018-05-01 MED ORDER — SODIUM CHLORIDE 0.9 % IV BOLUS
1000.0000 mL | Freq: Once | INTRAVENOUS | Status: AC
  Administered 2018-05-01: 1000 mL via INTRAVENOUS

## 2018-05-01 MED ORDER — ONDANSETRON 4 MG PO TBDP: 4.0000 mg | ORAL_TABLET | Freq: Three times a day (TID) | ORAL | 0 refills | Status: DC | PRN

## 2018-05-01 MED ORDER — DIAZEPAM 5 MG/ML IJ SOLN
5.0000 mg | Freq: Once | INTRAMUSCULAR | Status: AC
Start: 2018-05-01 — End: 2018-05-01
  Administered 2018-05-01: 5 mg via INTRAVENOUS
  Filled 2018-05-01: qty 2

## 2018-05-01 MED ORDER — ONDANSETRON HCL 4 MG/2ML IJ SOLN
4.0000 mg | Freq: Once | INTRAMUSCULAR | Status: AC
  Administered 2018-05-01: 4 mg via INTRAVENOUS
  Filled 2018-05-01: qty 2

## 2018-05-01 MED ORDER — METOCLOPRAMIDE HCL 5 MG/ML IJ SOLN
10.0000 mg | Freq: Once | INTRAMUSCULAR | Status: AC
  Administered 2018-05-01: 10 mg via INTRAVENOUS
  Filled 2018-05-01: qty 2

## 2018-05-01 NOTE — Discharge Instructions (Signed)
Take the prescribed medication as directed if feeling symptoms again.  Make sure to stay hydrated at home. Follow-up with your primary care doctor. Return to the ED for new or worsening symptoms.

## 2018-05-01 NOTE — ED Notes (Signed)
Pt able to get and walk, states that he feels better

## 2018-05-01 NOTE — ED Provider Notes (Signed)
[facility] EMERGENCY DEPARTMENT Provider Note   CSN: [id number] Arrival date & time: 04/30/18  2217     History   Chief Complaint Chief Complaint  Patient presents with  . Weakness    HPI Perry Gibbs is a 32 y.o. male.  The history is provided by the patient and medical records.  Weakness  Primary symptoms include dizziness. Associated symptoms include vomiting.     32 y.o. F with DM, HTN, obesity, presenting to the ED for dizziness.  Wife reports this began last night around 9pm.  States he feels lightheaded and feels like things are moving around him.  This occurs when he moves, like trying to get up, or turning his head side to side.  States when this happens, he tries to focus which makes him nauseated and starts vomiting.  He reports "pressure" on top of his head but denies true headache.  Denies history of same in the past.  He does report some chest pain but thinks it is related to vomiting.  Has been able to drink and eat intermittently.  No diarrhea.  No abdominal pain.  No fever/chills.  No URI symptoms.  No prior cardiac history.  He is not a smoker.  No meds tried PTA.  Past Medical History:  Diagnosis Date  . Diabetes mellitus without complication (HCC)   . Hypertension   . Obesity     There are no active problems to display for this patient.   Past Surgical History:  Procedure Laterality Date  . KNEE ARTHROSCOPY W/ ACL RECONSTRUCTION          Home Medications    Prior to Admission medications   Medication Sig Start Date End Date Taking? Authorizing Provider  aspirin 325 MG EC tablet Take 325 mg by mouth daily.      [provider]  HYDROcodone-acetaminophen (NORCO) 5-325 MG per tablet Take 1 tablet by mouth every 6 (six) hours as needed. FOR PAIN     [provider]  ibuprofen (ADVIL,MOTRIN) 800 MG tablet Take 1 tablet (800 mg total) by mouth every 6 (six) hours as needed for mild pain or moderate pain. 08/26/16    Lyndal Pulley, MD  meclizine (ANTIVERT) 50 MG tablet Take 1 tablet (50 mg total) by mouth 2 (two) times daily as needed for dizziness or nausea. 10/03/14   Fayrene Helper, PA-C  methocarbamol (ROBAXIN) 500 MG tablet Take 500 mg by mouth every 8 (eight) hours as needed. FOR MUSCLE SPASMS     [provider]  ondansetron (ZOFRAN) 4 MG tablet Take 4 mg by mouth every 6 (six) hours as needed. FOR NAUSEA     [provider]  ranitidine (ZANTAC 150 MAXIMUM STRENGTH) 150 MG tablet Take 150 mg by mouth daily.      [provider]    Family History No family history on file.  Social History Social History   Tobacco Use  . Smoking status: Current Every Day Smoker  . Smokeless tobacco: Never Used  Substance Use Topics  . Alcohol use: No  . Drug use: No     Allergies   Patient has no known allergies.   Review of Systems Review of Systems  Gastrointestinal: Positive for nausea and vomiting.  Neurological: Positive for dizziness and weakness.  All other systems reviewed and are negative.    Physical Exam Updated Vital Signs BP 129/80 (BP Location: Right Arm)   Pulse 63   Temp 97.8 F (36.6 C) (Oral)  Resp 16   SpO2 92%   Physical Exam  Constitutional: He is oriented to person, place, and time. He appears well-developed and well-nourished.  HENT:  Head: Normocephalic and atraumatic.  Mouth/Throat: Oropharynx is clear and moist.  TMs normally bilaterally  Eyes: Pupils are equal, round, and reactive to light. Conjunctivae and EOM are normal.  Neck: Normal range of motion.  Cardiovascular: Normal rate, regular rhythm and normal heart sounds.  Pulmonary/Chest: Effort normal and breath sounds normal.  Abdominal: Soft. Bowel sounds are normal. There is no tenderness. There is no rebound.  Musculoskeletal: Normal range of motion.  Neurological: He is alert and oriented to person, place, and time.  AAOx3, answering questions and following commands  appropriately; equal strength UE and LE bilaterally; CN grossly intact; moves all extremities appropriately without ataxia; no focal neuro deficits or facial asymmetry appreciated; dizziness elicited when turning head left and right  Skin: Skin is warm and dry.  Psychiatric: He has a normal mood and affect.  Nursing note and vitals reviewed.    ED Treatments / Results  Labs (all labs ordered are listed, but only abnormal results are displayed) Labs Reviewed  BASIC METABOLIC PANEL - Abnormal; Notable for the following components:      Result Value   Glucose, Bld 133 (*)    All other components within normal limits  CBC  URINALYSIS, ROUTINE W REFLEX MICROSCOPIC  I-STAT TROPONIN, ED    EKG EKG Interpretation  Date/Time:  Tuesday May 01 2018 02:07:09 EST Ventricular Rate:  56 PR Interval:  204 QRS Duration: 100 QT Interval:  440 QTC Calculation: 424 R Axis:   70 Text Interpretation:  Sinus bradycardia Anterior infarct , age undetermined Abnormal ECG No significant change since last tracing Confirmed by Gilda Crease [phone]) on 05/01/2018 5:44:21 AM   Radiology Dg Chest 2 View  Result Date: 05/01/2018 CLINICAL DATA:  Acute onset of vertigo. Dizziness, nausea and vomiting. EXAM: CHEST - 2 VIEW COMPARISON:  Chest radiograph performed 10/03/2014 FINDINGS: The lungs are well-aerated. Mild peribronchial thickening is noted. There is no evidence of focal opacification, pleural effusion or pneumothorax. The heart is normal in size; the mediastinal contour is within normal limits. No acute osseous abnormalities are seen. IMPRESSION: Mild peribronchial thickening noted; lungs otherwise clear. Electronically Signed   By: Roanna Raider M.D.   On: 05/01/2018 01:50    Procedures Procedures (including critical care time)  Medications Ordered in ED Medications  sodium chloride 0.9 % bolus 1,000 mL (0 mLs Intravenous Stopped 05/01/18 0322)  meclizine (ANTIVERT) tablet 25 mg  (25 mg Oral Given 05/01/18 0159)  ondansetron (ZOFRAN) injection 4 mg (4 mg Intravenous Given 05/01/18 0159)  diazepam (VALIUM) injection 5 mg (5 mg Intravenous Given 05/01/18 0405)  metoCLOPramide (REGLAN) injection 10 mg (10 mg Intravenous Given 05/01/18 0405)  sodium chloride 0.9 % bolus 1,000 mL (0 mLs Intravenous Stopped 05/01/18 0539)     Initial Impression / Assessment and Plan / ED Course  I have reviewed the triage vital signs and the nursing notes.  Pertinent labs & imaging results that were available during my care of the patient were reviewed by me and considered in my medical decision making (see chart for details).  32 year old male here with dizziness and "pressure" on the top of his head.  This began last evening.  Worse with movement or turning his head causing nausea and vomiting.  He is awake, alert, appropriately oriented.  Neurologic exam is nonfocal.  Dizziness is elicited with  turning his head during exam.  TMs are clear.  No recent infectious symptoms.  EKG is nonischemic.  Labs pending.  Symptoms are suspicious for peripheral vertigo.   Given reassuring neurologic exam, do not feel he needs emergent head CT.  Given IV fluids, meclizine, Zofran.  Will monitor.  3:26 AM Labs reviewed, no acute findings.  CXR clear.  On recheck patient sleeping.  When awoken, states he still feels dizzy and nauseated when he moves.  Remains neurologically intact.  Will try different round of medications to see if we can get better symptom control.  Wife updated on results.  After dose of Valium and Reglan patient is feeling significantly better.  He was able to get up and ambulate around the department with steady gait.  He is tolerating oral fluids well at this time.  Remains neurologically intact.  Feel he is stable for discharge home.  He was given prescriptions for Valium and Zofran should symptoms recur.  He will follow-up closely with his primary care doctor.  He will return here for any  new or worsening symptoms.  Final Clinical Impressions(s) / ED Diagnoses   Final diagnoses:  Vertigo  Nausea    ED Discharge Orders         Ordered    diazepam (VALIUM) 5 MG tablet  Every 8 hours PRN     05/01/18 0549    ondansetron (ZOFRAN ODT) 4 MG disintegrating tablet  Every 8 hours PRN     05/01/18 0549           Garlon Hatchet, PA-C 05/01/18 0604    Gilda Crease, MD 05/02/18 [phone]

## 2020-02-26 ENCOUNTER — Emergency Department (HOSPITAL_COMMUNITY)
Admission: EM | Admit: 2020-02-26 | Discharge: 2020-02-27 | Disposition: A | Discharge status: Home / Self Care | Payer: Self-pay | Attending: Emergency Medicine | Admitting: Emergency Medicine

## 2020-02-26 ENCOUNTER — Other Ambulatory Visit: Payer: Self-pay

## 2020-02-26 DIAGNOSIS — I10 Essential (primary) hypertension: Secondary | ICD-10-CM | POA: Insufficient documentation

## 2020-02-26 DIAGNOSIS — F17200 Nicotine dependence, unspecified, uncomplicated: Secondary | ICD-10-CM | POA: Insufficient documentation

## 2020-02-26 DIAGNOSIS — Z7984 Long term (current) use of oral hypoglycemic drugs: Secondary | ICD-10-CM | POA: Insufficient documentation

## 2020-02-26 DIAGNOSIS — Z7982 Long term (current) use of aspirin: Secondary | ICD-10-CM | POA: Insufficient documentation

## 2020-02-26 DIAGNOSIS — N471 Phimosis: Secondary | ICD-10-CM | POA: Insufficient documentation

## 2020-02-26 DIAGNOSIS — E1165 Type 2 diabetes mellitus with hyperglycemia: Secondary | ICD-10-CM | POA: Insufficient documentation

## 2020-02-26 DIAGNOSIS — N481 Balanitis: Secondary | ICD-10-CM | POA: Insufficient documentation

## 2020-02-26 DIAGNOSIS — R739 Hyperglycemia, unspecified: Secondary | ICD-10-CM

## 2020-02-26 NOTE — ED Triage Notes (Signed)
Pt reports his PCP sent him here for circumcision d/t pain and swelling of penis x 8-9 days. Pain with urination as well.

## 2020-02-27 LAB — BASIC METABOLIC PANEL
Anion gap: 11 (ref 5–15)
BUN: 12 mg/dL (ref 6–20)
CO2: 24 mmol/L (ref 22–32)
Calcium: 9 mg/dL (ref 8.9–10.3)
Chloride: 99 mmol/L (ref 98–111)
Creatinine, Ser: 0.6 mg/dL — ABNORMAL LOW (ref 0.61–1.24)
GFR calc Af Amer: >60 mL/min (ref >60)
GFR calc non Af Amer: >60 mL/min (ref >60)
Glucose, Bld: 323 mg/dL — ABNORMAL HIGH (ref 70–99)
Potassium: 4 mmol/L (ref 3.5–5.1)
Sodium: 134 mmol/L — ABNORMAL LOW (ref 135–145)

## 2020-02-27 LAB — CBG MONITORING, ED: Glucose-Capillary: 384 mg/dL — ABNORMAL HIGH (ref 70–99)

## 2020-02-27 MED ORDER — SODIUM CHLORIDE 0.9 % IV BOLUS
1000.0000 mL | Freq: Once | INTRAVENOUS | Status: AC
  Administered 2020-02-27: 1000 mL via INTRAVENOUS

## 2020-02-27 MED ORDER — FLUCONAZOLE 200 MG PO TABS: 200.0000 mg | ORAL_TABLET | Freq: Every day | ORAL | 0 refills | Status: AC

## 2020-02-27 MED ORDER — MICONAZOLE NITRATE 2 % EX CREA: 1.0000 "application " | TOPICAL_CREAM | Freq: Two times a day (BID) | CUTANEOUS | 0 refills | Status: DC

## 2020-02-27 NOTE — Discharge Instructions (Signed)
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  This becomes an emergency if you are unable to urinate.  Otherwise you need to continue to work on controlling your blood sugars and treat yourself for balanitis.  You will be given medications.  Follow-up with urology.  Regarding your blood sugar, you need to follow-up closely with your primary physician.  Continue medications as prescribed and make sure to modify your diet.

## 2020-02-27 NOTE — ED Provider Notes (Signed)
[facility] EMERGENCY DEPARTMENT Provider Note   CSN: [id number] Arrival date & time: 02/26/20  1605     History Chief Complaint  Patient presents with  . Groin Swelling    Perry Gibbs is a 34 y.o. male.  HPI     This is a 34 year old male with a history of diabetes, hypertension, obesity who presents from his primary office with concerns for phimosis.  I have reviewed his outside records.  He has recently been treated for candidal infection of the penis.  Patient reports over the last 12 to 14 days he has had difficulty retracting his foreskin.  He can still urinate but "it sprays everywhere."  He reports itchiness of the glans of his penis and burning with urination.  It appears he was treated for candidal infection.  On evaluation yesterday in the office there was some concern for "arterial compromise" from inability to retract foreskin.  He was referred here for evaluation for possible circumcision.  Patient continues to have uncontrolled diabetes on metformin twice daily.  Denies back pains or fevers.  Patient is primarily Hispanic speaking.  Family translates at the bedside.  Additional history from chart reviewed.  Past Medical History:  Diagnosis Date  . Diabetes mellitus without complication (HCC)   . Hypertension   . Obesity     There are no problems to display for this patient.   Past Surgical History:  Procedure Laterality Date  . KNEE ARTHROSCOPY W/ ACL RECONSTRUCTION         No family history on file.  Social History   Tobacco Use  . Smoking status: Current Every Day Smoker  . Smokeless tobacco: Never Used  Substance Use Topics  . Alcohol use: No  . Drug use: No    Home Medications Prior to Admission medications   Medication Sig Start Date End Date Taking? Authorizing Provider  aspirin 325 MG EC tablet Take 325 mg by mouth daily.      [provider]  diazepam (VALIUM) 5 MG tablet Take 1 tablet (5 mg total) by  mouth every 8 (eight) hours as needed (dizziness). 05/01/18   Garlon Hatchet, PA-C  fluconazole (DIFLUCAN) 200 MG tablet Take 1 tablet (200 mg total) by mouth daily for 3 days. 02/27/20 03/01/20  Harout Scheurich, Mayer Masker, MD  HYDROcodone-acetaminophen (NORCO) 5-325 MG per tablet Take 1 tablet by mouth every 6 (six) hours as needed. FOR PAIN     [provider]  ibuprofen (ADVIL,MOTRIN) 800 MG tablet Take 1 tablet (800 mg total) by mouth every 6 (six) hours as needed for mild pain or moderate pain. 08/26/16   Lyndal Pulley, MD  meclizine (ANTIVERT) 50 MG tablet Take 1 tablet (50 mg total) by mouth 2 (two) times daily as needed for dizziness or nausea. 10/03/14   Fayrene Helper, PA-C  methocarbamol (ROBAXIN) 500 MG tablet Take 500 mg by mouth every 8 (eight) hours as needed. FOR MUSCLE SPASMS     [provider]  miconazole (MICOTIN) 2 % cream Apply 1 application topically 2 (two) times daily. 02/27/20   Reyonna Haack, Mayer Masker, MD  ondansetron (ZOFRAN ODT) 4 MG disintegrating tablet Take 1 tablet (4 mg total) by mouth every 8 (eight) hours as needed for nausea. 05/01/18   Garlon Hatchet, PA-C  ondansetron (ZOFRAN) 4 MG tablet Take 4 mg by mouth every 6 (six) hours as needed. FOR NAUSEA     [provider]  ranitidine (ZANTAC 150 MAXIMUM STRENGTH) 150 MG tablet  Take 150 mg by mouth daily.      [provider]    Allergies    Patient has no known allergies.  Review of Systems   Review of Systems  Constitutional: Negative for fever.  Respiratory: Negative for shortness of breath.   Cardiovascular: Negative for chest pain.  Gastrointestinal: Negative for abdominal pain, nausea and vomiting.  Genitourinary: Positive for dysuria.       Phimosis  All other systems reviewed and are negative.   Physical Exam Updated Vital Signs BP 131/87 (BP Location: Right Arm)   Pulse 78   Temp 98.5 F (36.9 C) (Oral)   Resp 16   SpO2 96%   Physical Exam Vitals and nursing note reviewed.    Constitutional:      Appearance: He is well-developed. He is obese. He is not ill-appearing.  HENT:     Head: Normocephalic and atraumatic.     Mouth/Throat:     Mouth: Mucous membranes are moist.  Eyes:     Pupils: Pupils are equal, round, and reactive to light.  Cardiovascular:     Rate and Rhythm: Normal rate and regular rhythm.     Heart sounds: Normal heart sounds. No murmur heard.   Pulmonary:     Effort: Pulmonary effort is normal. No respiratory distress.  Abdominal:     Palpations: Abdomen is soft.     Tenderness: There is no abdominal tenderness.  Genitourinary:    Comments: Uncircumcised penis, unable to retract foreskin to the glans although there appears to be adequate opening, no significant swelling of the foreskin, unable to visualize glans of the penis Musculoskeletal:     Cervical back: Neck supple.     Right lower leg: No edema.     Left lower leg: No edema.  Lymphadenopathy:     Cervical: No cervical adenopathy.  Skin:    General: Skin is warm and dry.  Neurological:     Mental Status: He is alert and oriented to person, place, and time.  Psychiatric:        Mood and Affect: Mood normal.     ED Results / Procedures / Treatments   Labs (all labs ordered are listed, but only abnormal results are displayed) Labs Reviewed  CBG MONITORING, ED - Abnormal; Notable for the following components:      Result Value   Glucose-Capillary 384 (*)    All other components within normal limits  URINE CULTURE  URINALYSIS, ROUTINE W REFLEX MICROSCOPIC  BASIC METABOLIC PANEL  CBG MONITORING, ED    EKG None  Radiology No results found.  Procedures Procedures (including critical care time)  Medications Ordered in ED Medications  sodium chloride 0.9 % bolus 1,000 mL (has no administration in time range)    ED Course  I have reviewed the triage vital signs and the nursing notes.  Pertinent labs & imaging results that were available during my care of the  patient were reviewed by me and considered in my medical decision making (see chart for details).  Clinical Course as of Feb 27 719  Thu Feb 27, 2020  7829 Spoke with Dr. Retta Diones.  He recommends outpatient urology follow-up.  Recommends Diflucan for several days as well as topical antifungal.  Discussed this with the patient.  Awaiting BMP to ensure no DKA and urinalysis.   [CH]    Clinical Course User Index [CH] Alyona Romack, Mayer Masker, MD   MDM Rules/Calculators/A&P  Patient presents upon request from his primary provider for concerns for emergent circumcision.  On exam he has a phimosis but at this time is able to urinate.  I can retract the opening some but cannot visualize the glans of the penis.  He does not have an emergent indication for circumcision at this time.  Patient is notably frustrated as he was told that this was an emergency.  He is waited in the waiting room for greater than 14 hours.  I discussed with him that it can become an emergency if he has inability to urinate.  He needs to continue to work on controlling his blood sugars and will be treated for balanitis.  Urology recommended in oral antifungal as well as topical.  This will be provided.  Urinalysis reviewed from outpatient clinic and largely reassuring without urinary tract infection.  His CBG is 384.  Will obtain a BMP to ensure no DKA.  Otherwise patient will be discharged with close urology follow-up.  Final Clinical Impression(s) / ED Diagnoses Final diagnoses:  Balanitis  Phimosis  Hyperglycemia    Rx / DC Orders ED Discharge Orders         Ordered    fluconazole (DIFLUCAN) 200 MG tablet  Daily        02/27/20 0717    miconazole (MICOTIN) 2 % cream  2 times daily        02/27/20 0717           Hermelinda Diegel, Mayer Masker, MD 02/27/20 [phone]

## 2020-02-27 NOTE — ED Notes (Signed)
Patient Alert and oriented to baseline. Stable and ambulatory to baseline. Patient verbalized understanding of the discharge instructions.  Patient belongings were taken by the patient.   

## 2020-02-27 NOTE — ED Provider Notes (Signed)
Signout from Dr. Wilkie Aye.  34 year old male poorly controlled diabetic sent in by his primary care doctor for evaluation of phimosis.  Sugar elevated here and plan is to follow-up on BMP to make sure he is not in DKA.  If this is negative he is to be discharged to follow-up outpatient with urology. Physical Exam  BP 131/87 (BP Location: Right Arm)   Pulse 78   Temp 98.5 F (36.9 C) (Oral)   Resp 16   SpO2 96%   Physical Exam  ED Course/Procedures   Clinical Course as of Feb 26 742  Thu Feb 27, 2020  8264 Spoke with Dr. Retta Diones.  He recommends outpatient urology follow-up.  Recommends Diflucan for several days as well as topical antifungal.  Discussed this with the patient.  Awaiting BMP to ensure no DKA and urinalysis.   [CH]    Clinical Course User Index [CH] Horton, Mayer Masker, MD    Procedures  MDM  Chemistry with elevated blood sugar normal gap no evidence of DKA.  Will discharge per plan of Dr. Wilkie Aye.      Terrilee Files, MD 02/27/20 [phone]

## 2020-02-27 NOTE — ED Notes (Signed)
MD Horton ambulating pt to TraB to assess groin in more private setting.

## 2020-07-20 ENCOUNTER — Other Ambulatory Visit: Payer: Self-pay | Admitting: Urology

## 2020-07-22 IMAGING — DX DG CHEST 2V
2 series · 2 of 2 positions shown · non-contrast
Comparison: Chest radiograph performed 10/03/2014

CLINICAL DATA: Acute onset of vertigo. Dizziness, nausea and
vomiting.

EXAM:
CHEST - 2 VIEW

[chest pa]
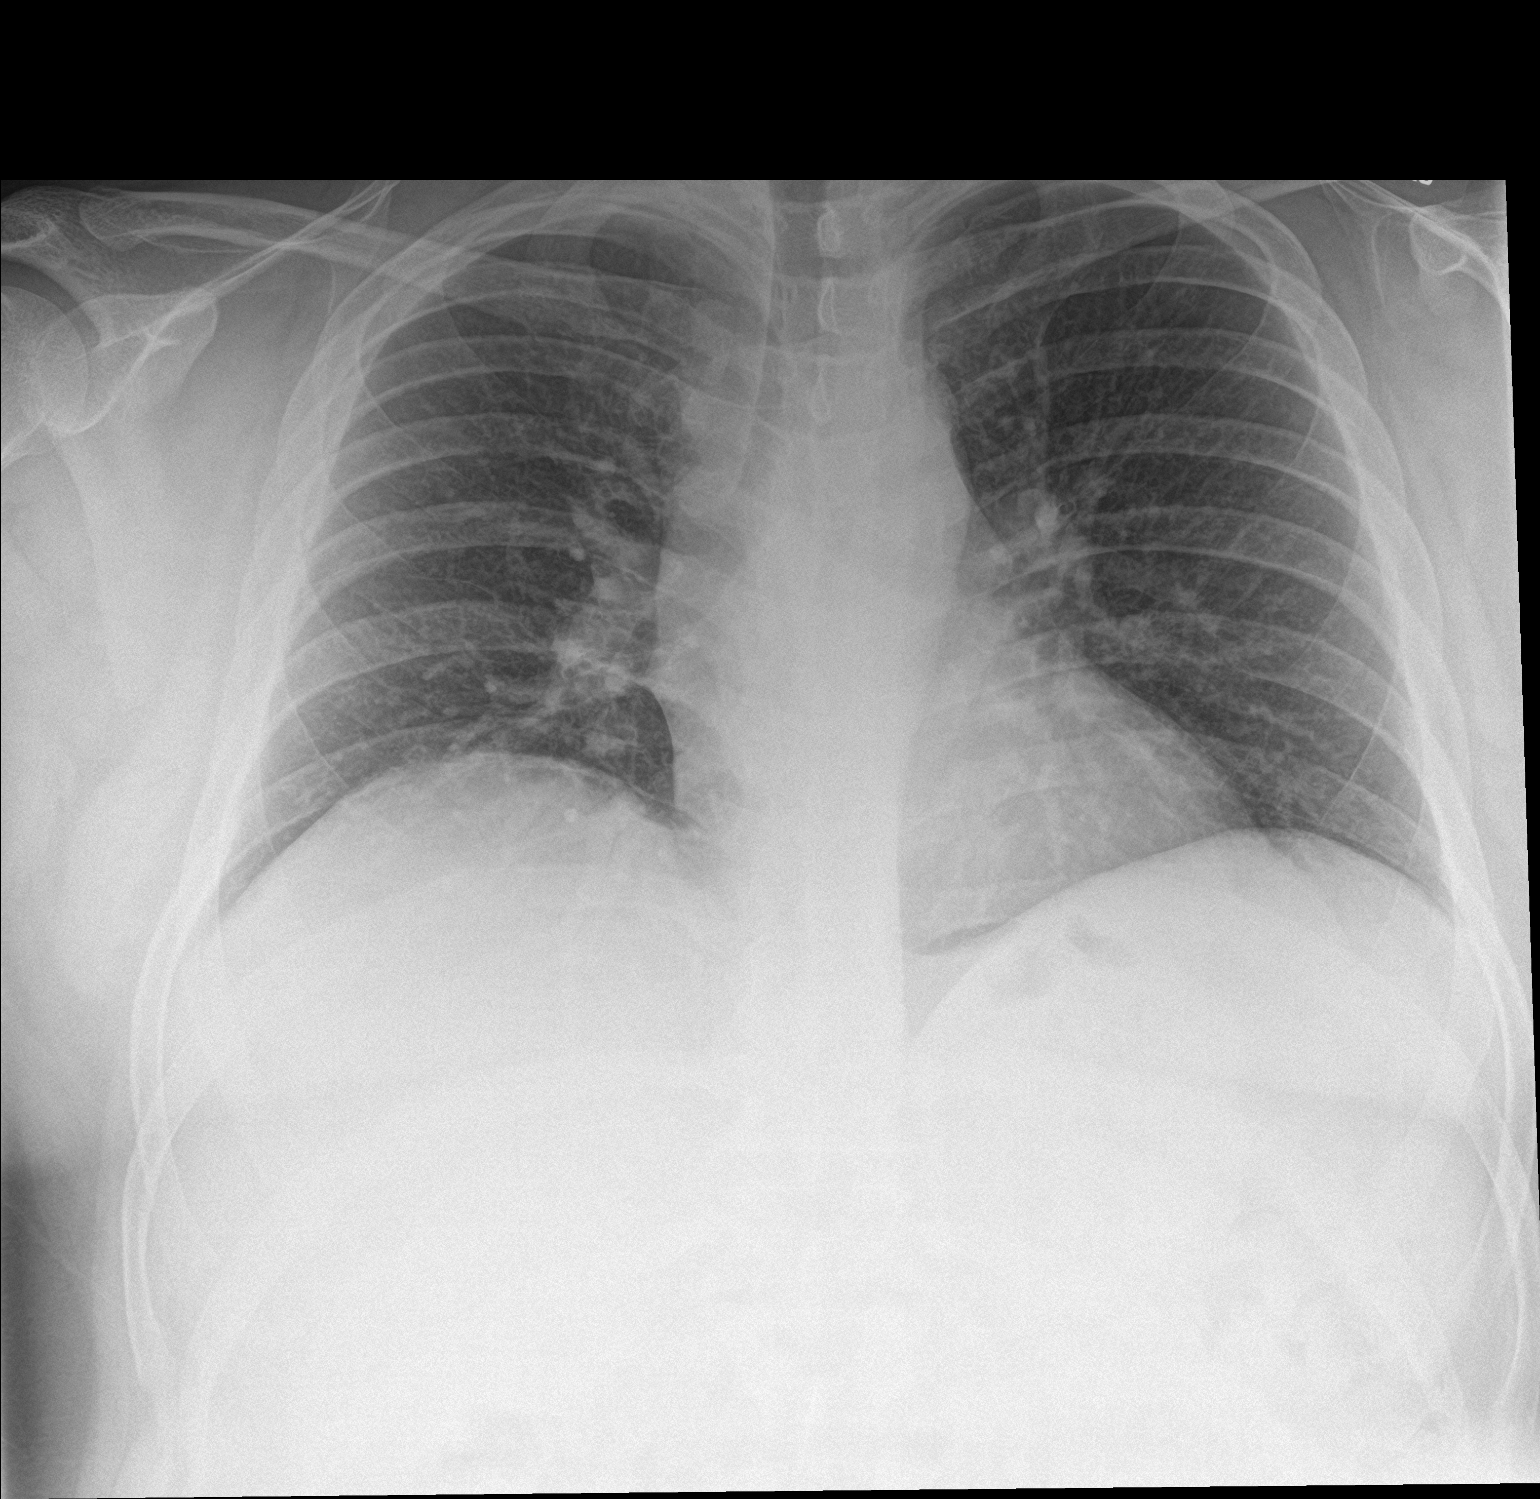

[chest lat]
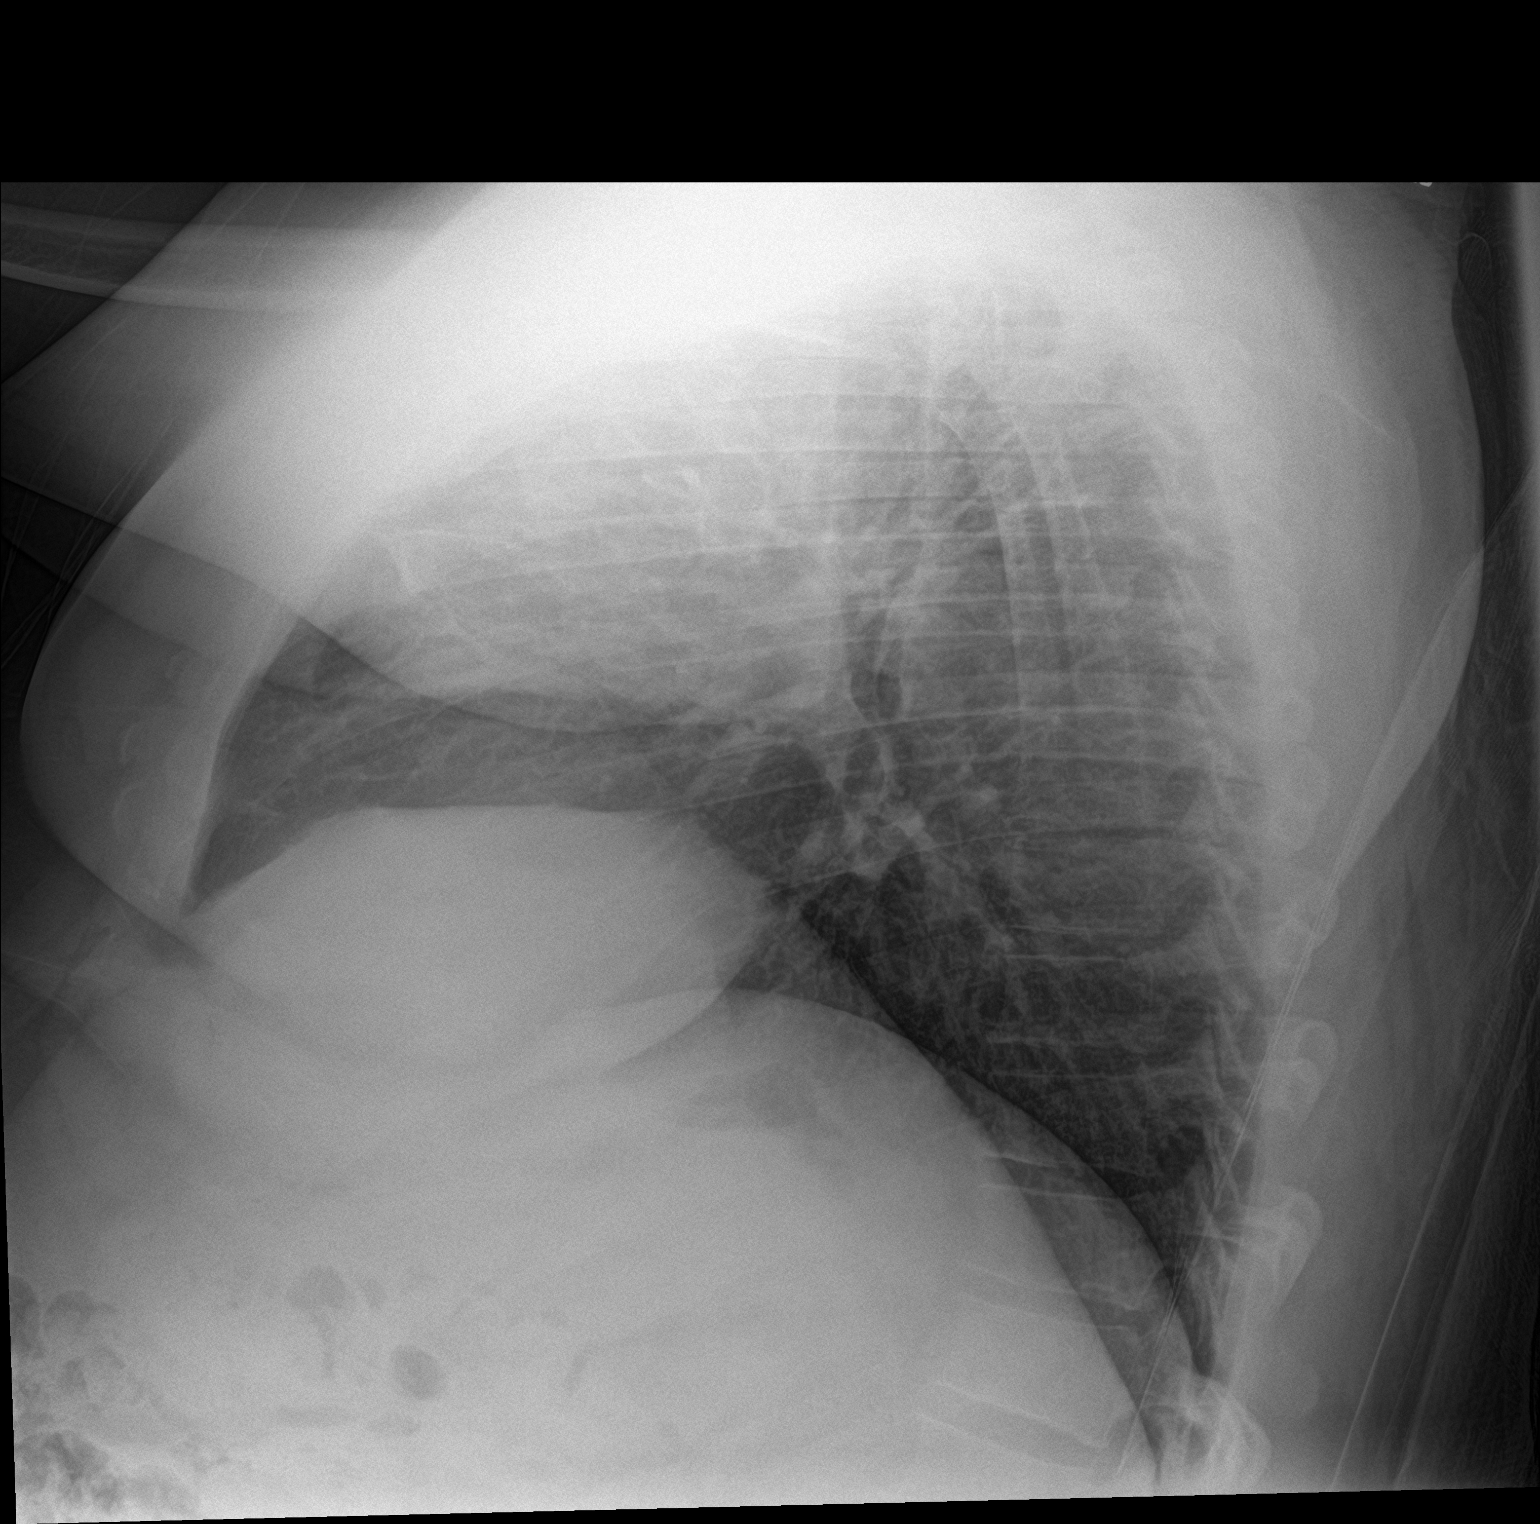

[2 of 2 positions shown; findings below may reference images not displayed]

FINDINGS: The lungs are well-aerated. Mild peribronchial thickening is noted.
There is no evidence of focal opacification, pleural effusion or
pneumothorax.

The heart is normal in size; the mediastinal contour is within
normal limits. No acute osseous abnormalities are seen.
IMPRESSION: Mild peribronchial thickening noted; lungs otherwise clear.

## 2020-08-10 ENCOUNTER — Encounter (HOSPITAL_BASED_OUTPATIENT_CLINIC_OR_DEPARTMENT_OTHER): Payer: Self-pay | Admitting: Urology

## 2020-08-10 ENCOUNTER — Other Ambulatory Visit: Payer: Self-pay

## 2020-08-10 NOTE — Progress Notes (Signed)
Spoke w/ via phone for pre-op interview---pt spouse dailin ramirez per pt request due to speaks spanish Lab needs dos----   I stat ekg           Lab results------none COVID test ------08-13-2020 1000 Arrive at -------1215 pm 08-14-2020 NPO after MN NO Solid Food.   Water  from MN until---1115 am then npo Medications to take morning of surgery -----omeprazole Diabetic medication -----none day of surgery Patient Special Instructions -----none Pre-Op special Istructions -----none Patient verbalized understanding of instructions that were given at this phone interview. Patient denies shortness of breath, chest pain, fever, cough at this phone interview.   Wife dailin ramirez cell [phone] to interpret dos release to be signed

## 2020-08-13 ENCOUNTER — Other Ambulatory Visit (HOSPITAL_COMMUNITY)
Admission: RE | Admit: 2020-08-13 | Discharge: 2020-08-13 | Disposition: A | Discharge status: Home / Self Care | Payer: HRSA Program | Source: Ambulatory Visit | Attending: Urology | Admitting: Urology

## 2020-08-13 DIAGNOSIS — U071 COVID-19: Secondary | ICD-10-CM | POA: Insufficient documentation

## 2020-08-13 LAB — SARS CORONAVIRUS 2 (TAT 6-24 HRS): SARS Coronavirus 2: POSITIVE — AB

## 2020-08-14 ENCOUNTER — Telehealth: Payer: Self-pay

## 2020-08-14 NOTE — Progress Notes (Signed)
Attempted to call Dr. Cardell Peach but his voicemail box was full.  [facility] notified of patient's positive covid test.  They stated they were already aware and will notify the office and Dr. Cardell Peach.

## 2020-08-14 NOTE — Telephone Encounter (Signed)
Called to discuss with patient about COVID-19 symptoms and the use of one of the available treatments for those with mild to moderate Covid symptoms and at a high risk of hospitalization.  Pt appears to qualify for outpatient treatment due to co-morbid conditions and/or a member of an at-risk group in accordance with the FDA Emergency Use Authorization.    Symptom onset: Flu symptoms - 08/11/20 Vaccinated: No Booster? No Immunocompromised? No Qualifiers: Obesity  Used Ecologist # [id number], pt. Declines.  Perry Gibbs

## 2020-09-15 ENCOUNTER — Encounter (HOSPITAL_BASED_OUTPATIENT_CLINIC_OR_DEPARTMENT_OTHER): Payer: Self-pay | Admitting: Urology

## 2020-09-15 NOTE — Progress Notes (Signed)
Spoke w/ via phone for pre-op interview---pt wife Perry Gibbs [phone]  due to pt speaks spanish Lab needs dos----  I stat,  ekg             Lab results------none COVID test ------positive covid 08-13-2020 epic no retest needed Arrive at -------1145 am 09-21-2020 NPO after MN NO Solid Food.  Water  from MN until---1045 am then npo Med rec completed Medications to take morning of surgery -----omeprazole Diabetic medication -----none day of sugery Patient instructed to bring photo id and insurance card day of surgery Patient aware to have Driver (ride ) / caregiver wife Perry Gibbs     for 24 hours after surgery  Patient Special Instructions -----none Pre-Op special Istructions -----none Patient verbalized understanding of instructions that were given at this phone interview. Patient denies shortness of breath, chest pain, fever, cough at this phone interview.  Pt wife Perry Gibbs will interpret spnaish for dos, release to be signed for dos.

## 2020-09-21 ENCOUNTER — Encounter (HOSPITAL_BASED_OUTPATIENT_CLINIC_OR_DEPARTMENT_OTHER)
Admission: RE | Disposition: A | Discharge status: Home / Self Care | Payer: Self-pay | Source: Home / Self Care | Attending: Urology

## 2020-09-21 ENCOUNTER — Ambulatory Visit (HOSPITAL_BASED_OUTPATIENT_CLINIC_OR_DEPARTMENT_OTHER)
Admission: RE | Admit: 2020-09-21 | Discharge: 2020-09-21 | Disposition: A | Discharge status: Home / Self Care | Payer: Self-pay | Attending: Urology | Admitting: Urology

## 2020-09-21 ENCOUNTER — Encounter (HOSPITAL_BASED_OUTPATIENT_CLINIC_OR_DEPARTMENT_OTHER): Payer: Self-pay | Admitting: Urology

## 2020-09-21 ENCOUNTER — Ambulatory Visit (HOSPITAL_BASED_OUTPATIENT_CLINIC_OR_DEPARTMENT_OTHER): Payer: Self-pay | Admitting: Anesthesiology

## 2020-09-21 DIAGNOSIS — E119 Type 2 diabetes mellitus without complications: Secondary | ICD-10-CM | POA: Insufficient documentation

## 2020-09-21 DIAGNOSIS — E669 Obesity, unspecified: Secondary | ICD-10-CM | POA: Insufficient documentation

## 2020-09-21 DIAGNOSIS — Z87891 Personal history of nicotine dependence: Secondary | ICD-10-CM | POA: Insufficient documentation

## 2020-09-21 DIAGNOSIS — I10 Essential (primary) hypertension: Secondary | ICD-10-CM | POA: Insufficient documentation

## 2020-09-21 DIAGNOSIS — N471 Phimosis: Secondary | ICD-10-CM | POA: Insufficient documentation

## 2020-09-21 HISTORY — DX: Gastro-esophageal reflux disease without esophagitis: K21.9

## 2020-09-21 HISTORY — DX: Dermatitis, unspecified: L30.9

## 2020-09-21 HISTORY — DX: Phimosis: N47.1

## 2020-09-21 HISTORY — PX: CIRCUMCISION: SHX1350

## 2020-09-21 HISTORY — DX: Type 2 diabetes mellitus without complications: E11.9

## 2020-09-21 LAB — GLUCOSE, CAPILLARY: Glucose-Capillary: 139 mg/dL — ABNORMAL HIGH (ref 70–99)

## 2020-09-21 LAB — POCT I-STAT, CHEM 8
BUN: 21 mg/dL — ABNORMAL HIGH (ref 6–20)
Calcium, Ion: 1.29 mmol/L (ref 1.15–1.40)
Chloride: 101 mmol/L (ref 98–111)
Creatinine, Ser: 0.7 mg/dL (ref 0.61–1.24)
Glucose, Bld: 128 mg/dL — ABNORMAL HIGH (ref 70–99)
HCT: 45 % (ref 39.0–52.0)
Hemoglobin: 15.3 g/dL (ref 13.0–17.0)
Potassium: 3.8 mmol/L (ref 3.5–5.1)
Sodium: 140 mmol/L (ref 135–145)
TCO2: 28 mmol/L (ref 22–32)

## 2020-09-21 SURGERY — CIRCUMCISION, ADULT
Anesthesia: General

## 2020-09-21 MED ORDER — DEXTROSE 5 % IV SOLN
3.0000 g | Freq: Once | INTRAVENOUS | Status: AC
  Administered 2020-09-21: 3 g via INTRAVENOUS
  Filled 2020-09-21: qty 3

## 2020-09-21 MED ORDER — LIDOCAINE 2% (20 MG/ML) 5 ML SYRINGE
INTRAMUSCULAR | Status: AC
  Filled 2020-09-21: qty 5

## 2020-09-21 MED ORDER — ACETAMINOPHEN 10 MG/ML IV SOLN
INTRAVENOUS | Status: DC | PRN
  Administered 2020-09-21: 1000 mg via INTRAVENOUS

## 2020-09-21 MED ORDER — FENTANYL CITRATE (PF) 250 MCG/5ML IJ SOLN
INTRAMUSCULAR | Status: AC
  Filled 2020-09-21: qty 5

## 2020-09-21 MED ORDER — PROPOFOL 10 MG/ML IV BOLUS
INTRAVENOUS | Status: DC | PRN
  Administered 2020-09-21: 50 mg via INTRAVENOUS
  Administered 2020-09-21: 60 mg via INTRAVENOUS
  Administered 2020-09-21: 200 mg via INTRAVENOUS

## 2020-09-21 MED ORDER — ONDANSETRON HCL 4 MG/2ML IJ SOLN: 4.0000 mg | Freq: Once | INTRAMUSCULAR | Status: DC | PRN

## 2020-09-21 MED ORDER — MIDAZOLAM HCL 2 MG/2ML IJ SOLN
INTRAMUSCULAR | Status: AC
  Filled 2020-09-21: qty 2

## 2020-09-21 MED ORDER — CEFAZOLIN SODIUM-DEXTROSE 1-4 GM/50ML-% IV SOLN
INTRAVENOUS | Status: AC
  Filled 2020-09-21: qty 50

## 2020-09-21 MED ORDER — PROPOFOL 10 MG/ML IV BOLUS
INTRAVENOUS | Status: AC
  Filled 2020-09-21: qty 20

## 2020-09-21 MED ORDER — LACTATED RINGERS IV SOLN: INTRAVENOUS | Status: DC

## 2020-09-21 MED ORDER — FENTANYL CITRATE (PF) 100 MCG/2ML IJ SOLN
INTRAMUSCULAR | Status: DC | PRN
  Administered 2020-09-21: 50 ug via INTRAVENOUS
  Administered 2020-09-21 (×2): 100 ug via INTRAVENOUS

## 2020-09-21 MED ORDER — BUPIVACAINE HCL 0.5 % IJ SOLN
INTRAMUSCULAR | Status: DC | PRN
  Administered 2020-09-21: 20 mL

## 2020-09-21 MED ORDER — DEXAMETHASONE SODIUM PHOSPHATE 10 MG/ML IJ SOLN
INTRAMUSCULAR | Status: AC
  Filled 2020-09-21: qty 1

## 2020-09-21 MED ORDER — MIDAZOLAM HCL 5 MG/5ML IJ SOLN
INTRAMUSCULAR | Status: DC | PRN
  Administered 2020-09-21: 2 mg via INTRAVENOUS

## 2020-09-21 MED ORDER — ONDANSETRON HCL 4 MG/2ML IJ SOLN
INTRAMUSCULAR | Status: AC
  Filled 2020-09-21: qty 2

## 2020-09-21 MED ORDER — OXYCODONE HCL 5 MG PO TABS
5.0000 mg | ORAL_TABLET | Freq: Once | ORAL | Status: DC | PRN
Start: 2020-09-21 — End: 2020-09-21

## 2020-09-21 MED ORDER — OXYCODONE-ACETAMINOPHEN 5-325 MG PO TABS: 1.0000 | ORAL_TABLET | ORAL | 0 refills | Status: AC | PRN

## 2020-09-21 MED ORDER — KETOROLAC TROMETHAMINE 30 MG/ML IJ SOLN
INTRAMUSCULAR | Status: DC | PRN
  Administered 2020-09-21: 30 mg via INTRAVENOUS

## 2020-09-21 MED ORDER — ACETAMINOPHEN 160 MG/5ML PO SOLN
325.0000 mg | ORAL | Status: DC | PRN
Start: 2020-09-21 — End: 2020-09-21

## 2020-09-21 MED ORDER — LIDOCAINE 2% (20 MG/ML) 5 ML SYRINGE
INTRAMUSCULAR | Status: DC | PRN
  Administered 2020-09-21: 100 mg via INTRAVENOUS

## 2020-09-21 MED ORDER — DEXAMETHASONE SODIUM PHOSPHATE 10 MG/ML IJ SOLN
INTRAMUSCULAR | Status: DC | PRN
  Administered 2020-09-21: 5 mg via INTRAVENOUS

## 2020-09-21 MED ORDER — CEFAZOLIN SODIUM-DEXTROSE 2-4 GM/100ML-% IV SOLN
INTRAVENOUS | Status: AC
  Filled 2020-09-21: qty 100

## 2020-09-21 MED ORDER — BACITRACIN ZINC 500 UNIT/GM EX OINT
TOPICAL_OINTMENT | CUTANEOUS | Status: DC | PRN
  Administered 2020-09-21: 1 via TOPICAL

## 2020-09-21 MED ORDER — ONDANSETRON HCL 4 MG/2ML IJ SOLN
INTRAMUSCULAR | Status: DC | PRN
  Administered 2020-09-21: 4 mg via INTRAVENOUS

## 2020-09-21 MED ORDER — KETOROLAC TROMETHAMINE 30 MG/ML IJ SOLN
INTRAMUSCULAR | Status: AC
  Filled 2020-09-21: qty 1

## 2020-09-21 MED ORDER — MEPERIDINE HCL 25 MG/ML IJ SOLN: 6.2500 mg | INTRAMUSCULAR | Status: DC | PRN

## 2020-09-21 MED ORDER — ACETAMINOPHEN 325 MG PO TABS: 325.0000 mg | ORAL_TABLET | ORAL | Status: DC | PRN

## 2020-09-21 MED ORDER — FENTANYL CITRATE (PF) 100 MCG/2ML IJ SOLN: 25.0000 ug | INTRAMUSCULAR | Status: DC | PRN

## 2020-09-21 MED ORDER — ACETAMINOPHEN 10 MG/ML IV SOLN
INTRAVENOUS | Status: AC
  Filled 2020-09-21: qty 100

## 2020-09-21 MED ORDER — OXYCODONE HCL 5 MG/5ML PO SOLN: 5.0000 mg | Freq: Once | ORAL | Status: DC | PRN

## 2020-09-21 SURGICAL SUPPLY — 30 items
BLADE SURG 15 STRL LF DISP TIS (BLADE) ×1 IMPLANT
BLADE SURG 15 STRL SS (BLADE) ×3
BNDG COHESIVE 1X5 TAN NS LF (GAUZE/BANDAGES/DRESSINGS) ×3 IMPLANT
BNDG COHESIVE 1X5 TAN STRL LF (GAUZE/BANDAGES/DRESSINGS) ×3 IMPLANT
BNDG CONFORM 2 STRL LF (GAUZE/BANDAGES/DRESSINGS) ×3 IMPLANT
CORD BIPOLAR FORCEPS 12FT (ELECTRODE) ×3 IMPLANT
COVER BACK TABLE 60X90IN (DRAPES) ×3 IMPLANT
COVER MAYO STAND STRL (DRAPES) ×3 IMPLANT
COVER WAND RF STERILE (DRAPES) ×3 IMPLANT
DECANTER SPIKE VIAL GLASS SM (MISCELLANEOUS) IMPLANT
DRAPE LAPAROTOMY 100X72 PEDS (DRAPES) ×3 IMPLANT
DRSG TEGADERM 4X4.75 (GAUZE/BANDAGES/DRESSINGS) IMPLANT
ELECT NEEDLE TIP 2.8 STRL (NEEDLE) ×3 IMPLANT
ELECT REM PT RETURN 9FT ADLT (ELECTROSURGICAL) ×3
ELECTRODE REM PT RTRN 9FT ADLT (ELECTROSURGICAL) ×1 IMPLANT
GAUZE XEROFORM 1X8 LF (GAUZE/BANDAGES/DRESSINGS) ×3 IMPLANT
GLOVE SURG ENC MOIS LTX SZ7 (GLOVE) ×3 IMPLANT
GLOVE SURG UNDER POLY LF SZ7.5 (GLOVE) ×3 IMPLANT
GOWN STRL REUS W/TWL LRG LVL3 (GOWN DISPOSABLE) ×3 IMPLANT
KIT TURNOVER CYSTO (KITS) ×3 IMPLANT
NEEDLE HYPO 25X1 1.5 SAFETY (NEEDLE) IMPLANT
NS IRRIG 500ML POUR BTL (IV SOLUTION) IMPLANT
PACK BASIN DAY SURGERY FS (CUSTOM PROCEDURE TRAY) ×3 IMPLANT
PENCIL SMOKE EVACUATOR (MISCELLANEOUS) ×3 IMPLANT
SUT VIC AB 4-0 SH 27 (SUTURE) ×6
SUT VIC AB 4-0 SH 27XANBCTRL (SUTURE) ×2 IMPLANT
SYR CONTROL 10ML LL (SYRINGE) IMPLANT
TOWEL OR 17X26 10 PK STRL BLUE (TOWEL DISPOSABLE) ×3 IMPLANT
TRAY DSU PREP LF (CUSTOM PROCEDURE TRAY) ×3 IMPLANT
WATER STERILE IRR 500ML POUR (IV SOLUTION) IMPLANT

## 2020-09-21 NOTE — H&P (Signed)
Urology Preoperative H&P   Chief Complaint: Phimosis  History of Present Illness: Perry Gibbs is a 35 y.o. male with phimosis here for circumcision. Denies fevers, chills.     Past Medical History:  Diagnosis Date  . DM type 2 (diabetes mellitus, type 2) (HCC)   . Eczema    face uses lotion prn  . GERD (gastroesophageal reflux disease)   . Hypertension   . Obesity   . Phimosis     Past Surgical History:  Procedure Laterality Date  . KNEE ARTHROSCOPY W/ ACL RECONSTRUCTION Left 2014  . left hand surgery  2006    Allergies: No Known Allergies  History reviewed. No pertinent family history.  Social History:  reports that he quit smoking about 2 months ago. His smoking use included cigarettes. He has a 6.50 pack-year smoking history. He has never used smokeless tobacco. He reports previous alcohol use. He reports that he does not use drugs.  ROS: A complete review of systems was performed.  All systems are negative except for pertinent findings as noted.  Physical Exam:  Vital signs in last 24 hours:   Constitutional:  Alert and oriented, No acute distress Cardiovascular: Regular rate and rhythm Respiratory: Normal respiratory effort, Lungs clear bilaterally GI: Abdomen is soft, nontender, nondistended, no abdominal masses GU: No CVA tenderness Lymphatic: No lymphadenopathy Neurologic: Grossly intact, no focal deficits Psychiatric: Normal mood and affect  Laboratory Data:  No results for input(s): WBC, HGB, HCT, PLT in the last 72 hours.  No results for input(s): NA, K, CL, GLUCOSE, BUN, CALCIUM, CREATININE in the last 72 hours.  Invalid input(s): CO3   No results found for this or any previous visit (from the past 24 hour(s)). No results found for this or any previous visit (from the past 240 hour(s)).  Renal Function: No results for input(s): CREATININE in the last 168 hours. CrCl cannot be calculated (Patient's most recent lab result is older than the  maximum 21 days allowed.).  Radiologic Imaging: No results found.  I independently reviewed the above imaging studies.  Assessment and Plan Perry Gibbs is a 35 y.o. male with phimosis here for circumcision.  Matt R. Merit Maybee MD 09/21/2020, 11:54 AM  [facility] Pager: [phone]): [phone]

## 2020-09-21 NOTE — Anesthesia Procedure Notes (Signed)
Procedure Name: LMA Insertion Date/Time: 09/21/2020 2:00 PM Performed by: Bishop Limbo, CRNA Pre-anesthesia Checklist: Patient identified, Emergency Drugs available, Suction available and Patient being monitored Patient Re-evaluated:Patient Re-evaluated prior to induction Oxygen Delivery Method: Circle System Utilized Preoxygenation: Pre-oxygenation with 100% oxygen Induction Type: IV induction Ventilation: Mask ventilation without difficulty LMA: LMA inserted LMA Size: 5.0 Number of attempts: 1 Placement Confirmation: positive ETCO2 Tube secured with: Tape Dental Injury: Teeth and Oropharynx as per pre-operative assessment  Comments: LMA inserted by Dr. Desmond Lope.

## 2020-09-21 NOTE — Op Note (Signed)
Operative Note  Preoperative diagnosis:  1.  Phimosis  Postoperative diagnosis: 1.  Phimosis  Procedure(s): 1.  Circumcision with frenuloplasty 2. Dorsal penile nerve block  Surgeon: Jettie Pagan, MD  Assistants:  None  Anesthesia:  General  Complications:  None  EBL:  <19ml  Specimens: None  Drains/Catheters: 1.  none  Intraoperative findings:   1. Phimosis 2. Uncomplicated circumcision  Indication:  Perry Gibbs is a 35 y.o. male who presented with phimosis and after being counseled with options he elected to have circumcision.   Description of procedure: The patient was brought back to the operating room, placed on table in a supine position.  He had bilateral sequential compression devices placed and received a dose of Ancef 2 gr preoperatively for antibiotic prophylaxis.  He was placed under general LM anesthesia and then standard prep was performed.  He was prepped and draped in standard sterile fashion.  Surgical time-out was performed identifying the correct patient, procedure, and all were in agreement.   A dorsal penile nerve block was given with .25% Marcaine plain, a total of 71mL used. The prepuce was retracted and a subcoronal incision made at 1 cm from sulcus. Hemostasis was obtained with bipolar. The frenulum was reapproximated with a running and interrupted 4-0 vicryl. Hemostats were used to provide traction on the redundant prepuce and a dorsal slit was made with mets the incise the appropriate length of skin for removal. An anchoring interrupted 4-0 vicryl was placed reapproximate the skin, leaving the tail long to provide traction. Scissors were used to make a ventral slit and another vicryl was placed at this location. An incision was made on the skin between these two vicryls. The tissue was then removed with cautery to control any blood vessels at the dartos fascia. Procedure was repeated on the other side, incising the skin and removing with cautery.  Hemostasis was intact. Interrupted 4-0 vicryls were placed in interrupted fashion. Additional vicryls were placed at the frenulum for hemostasis. Satisfied with the cosmesis, we finalized the case with the dressing; Xerform, covered loosely with gauze and coban.  Plan:  The patient was awoken from general anesthesia, transferred to the PACU in stable condition.  He will be recovered in the PACU and then discharged.  Matt R. Marit Goodwill MD [facility]  Pager: [phone]

## 2020-09-21 NOTE — Anesthesia Postprocedure Evaluation (Signed)
Anesthesia Post Note  Patient: Perry Gibbs  Procedure(s) Performed: CIRCUMCISION ADULT (N/A )     Patient location during evaluation: PACU Anesthesia Type: General Level of consciousness: awake and alert, awake and oriented Pain management: pain level controlled Vital Signs Assessment: post-procedure vital signs reviewed and stable Respiratory status: spontaneous breathing, nonlabored ventilation and respiratory function stable Cardiovascular status: blood pressure returned to baseline and stable Postop Assessment: no apparent nausea or vomiting Anesthetic complications: no   No complications documented.  Last Vitals:  Vitals:   09/21/20 1530 09/21/20 1600  BP: (!) 149/88 140/88  Pulse: 68 68  Resp: (!) 27 16  Temp:  36.6 C  SpO2: 94% 96%    Last Pain:  Vitals:   09/21/20 1600  TempSrc:   PainSc: 0-No pain                 Cecile Hearing

## 2020-09-21 NOTE — Anesthesia Preprocedure Evaluation (Addendum)
Anesthesia Evaluation  Patient identified by MRN, date of birth, ID band Patient awake    Reviewed: Allergy & Precautions, H&P , NPO status , Patient's Chart, lab work & pertinent test results, reviewed documented beta blocker date and time   Airway Mallampati: III  TM Distance: >3 FB Neck ROM: full    Dental no notable dental hx. (+) Teeth Intact, Dental Advisory Given   Pulmonary former smoker,    Pulmonary exam normal breath sounds clear to auscultation       Cardiovascular Exercise Tolerance: Good hypertension, Pt. on medications  Rhythm:regular Rate:Normal     Neuro/Psych    GI/Hepatic GERD  Medicated,  Endo/Other  diabetes, Oral Hypoglycemic AgentsMorbid obesity  Renal/GU      Musculoskeletal   Abdominal   Peds  Hematology negative hematology ROS (+)   Anesthesia Other Findings   Reproductive/Obstetrics                            Anesthesia Physical Anesthesia Plan  ASA: III  Anesthesia Plan: General   Post-op Pain Management:    Induction: Intravenous  PONV Risk Score and Plan: 2 and Ondansetron and Dexamethasone  Airway Management Planned: LMA  Additional Equipment:   Intra-op Plan:   Post-operative Plan:   Informed Consent: I have reviewed the patients History and Physical, chart, labs and discussed the procedure including the risks, benefits and alternatives for the proposed anesthesia with the patient or authorized representative who has indicated his/her understanding and acceptance.     Dental Advisory Given  Plan Discussed with: CRNA and Anesthesiologist  Anesthesia Plan Comments: ( )        Anesthesia Quick Evaluation

## 2020-09-21 NOTE — Discharge Instructions (Signed)
   Activity:  You are encouraged to ambulate frequently (about every hour during waking hours) to help prevent blood clots from forming in your legs or lungs.     Diet: You should advance your diet as instructed by your physician.  It will be normal to have some bloating, nausea, and abdominal discomfort intermittently.   Prescriptions:  You will be provided a prescription for pain medication to take as needed.  If your pain is not severe enough to require the prescription pain medication, you may take extra strength Tylenol instead which will have less side effects.  You should also take a prescribed stool softener to avoid straining with bowel movements as the prescription pain medication may constipate you.   What to call us about: You should call the office [phone]) if you develop fever > 101 or develop persistent vomiting. Activity:  You are encouraged to ambulate frequently (about every hour during waking hours) to help prevent blood clots from forming in your legs or lungs.    Post Anesthesia Home Care Instructions  Activity: Get plenty of rest for the remainder of the day. A responsible adult should stay with you for 24 hours following the procedure.  For the next 24 hours, DO NOT: -Drive a car -Advertising copywriter -Drink alcoholic beverages -Take any medication unless instructed by your physician -Make any legal decisions or sign important papers.  Meals: Start with liquid foods such as gelatin or soup. Progress to regular foods as tolerated. Avoid greasy, spicy, heavy foods. If nausea and/or vomiting occur, drink only clear liquids until the nausea and/or vomiting subsides. Call your physician if vomiting continues.  Special Instructions/Symptoms: Your throat may feel dry or sore from the anesthesia or the breathing tube placed in your throat during surgery. If this causes discomfort, gargle with warm salt water. The discomfort should disappear within 24 hours.  If you had a  scopolamine patch placed behind your ear for the management of post- operative nausea and/or vomiting:  1. The medication in the patch is effective for 72 hours, after which it should be removed.  Wrap patch in a tissue and discard in the trash. Wash hands thoroughly with soap and water. 2. You may remove the patch earlier than 72 hours if you experience unpleasant side effects which may include dry mouth, dizziness or visual disturbances. 3. Avoid touching the patch. Wash your hands with soap and water after contact with the patch.      You will have a bandage on your penis. This will be removed in 24-48 hours. If you have difficulty feeling the end of your penis, remove the bandage.

## 2020-09-21 NOTE — Transfer of Care (Signed)
Immediate Anesthesia Transfer of Care Note  Patient: Perry Gibbs  Procedure(s) Performed: CIRCUMCISION ADULT (N/A )  Patient Location: PACU  Anesthesia Type:General  Level of Consciousness: awake, alert , oriented and patient cooperative  Airway & Oxygen Therapy: Patient Spontanous Breathing and Patient connected to nasal cannula oxygen  Post-op Assessment: Report given to RN and Post -op Vital signs reviewed and stable  Post vital signs: Reviewed and stable  Last Vitals:  Vitals Value Taken Time  BP 137/84 09/21/20 1509  Temp    Pulse 66 09/21/20 1511  Resp 19 09/21/20 1511  SpO2 96 % 09/21/20 1511  Vitals shown include unvalidated device data.  Last Pain:  Vitals:   09/21/20 1207  TempSrc: Oral  PainSc: 0-No pain      Patients Stated Pain Goal: 5 (09/21/20 1207)  Complications: No complications documented.

## 2020-09-22 ENCOUNTER — Encounter (HOSPITAL_BASED_OUTPATIENT_CLINIC_OR_DEPARTMENT_OTHER): Payer: Self-pay | Admitting: Urology
